# Patient Record
Sex: Female | Born: 2000 | Race: Asian | Hispanic: Yes | Marital: Single | Smoking: Never smoker
Health system: Southern US, Community
[De-identification: ages and names within clinical notes are randomized; demographics above are authoritative.]

---
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
## 2020-09-13 ENCOUNTER — Ambulatory Visit
Admission: EM | Admit: 2020-09-13 | Discharge: 2020-09-13 | Disposition: A | Discharge status: Home / Self Care | Payer: Medicaid Other | Attending: Emergency Medicine | Admitting: Emergency Medicine

## 2020-09-13 ENCOUNTER — Other Ambulatory Visit: Payer: Self-pay

## 2020-09-13 ENCOUNTER — Ambulatory Visit (INDEPENDENT_AMBULATORY_CARE_PROVIDER_SITE_OTHER): Payer: Medicaid Other

## 2020-09-13 DIAGNOSIS — S161XXA Strain of muscle, fascia and tendon at neck level, initial encounter: Secondary | ICD-10-CM

## 2020-09-13 DIAGNOSIS — M542 Cervicalgia: Secondary | ICD-10-CM | POA: Diagnosis not present

## 2020-09-13 DIAGNOSIS — S8001XA Contusion of right knee, initial encounter: Secondary | ICD-10-CM | POA: Diagnosis not present

## 2020-09-13 DIAGNOSIS — M25561 Pain in right knee: Secondary | ICD-10-CM | POA: Diagnosis not present

## 2020-09-13 MED ORDER — TIZANIDINE HCL 4 MG PO TABS: 4.0000 mg | ORAL_TABLET | Freq: Four times a day (QID) | ORAL | 0 refills | Status: DC | PRN

## 2020-09-13 MED ORDER — IBUPROFEN 600 MG PO TABS: 600.0000 mg | ORAL_TABLET | Freq: Four times a day (QID) | ORAL | 0 refills | Status: DC | PRN
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
## 2020-09-13 NOTE — Discharge Instructions (Addendum)
Take the ibuprofen every 6 hours as needed for pain and inflammation.  Take it with food.  Take the tizanidine every 6 hours as needed for spasm in your neck.  This will make you drowsy so you may want to save it for bedtime.  Follow the neck exercises given to you at discharge.  You may use ice or moist heat to help with the pain and inflammation of your knee and neck.  If your symptoms continue or worsen return or follow-up with your primary care provider.
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
## 2021-04-14 ENCOUNTER — Other Ambulatory Visit: Payer: Self-pay

## 2021-04-14 ENCOUNTER — Ambulatory Visit (LOCAL_COMMUNITY_HEALTH_CENTER): Payer: Medicaid Other

## 2021-04-14 DIAGNOSIS — Z5321 Procedure and treatment not carried out due to patient leaving prior to being seen by health care provider: Secondary | ICD-10-CM

## 2021-04-14 NOTE — Progress Notes (Signed)
Client and older lady knocked on door while RN on phone with Spanish speaking client and agency interpreter. Older lady stated immediately "we thought she needed a Hepatitis B." Client holding a [facility] record in her hand. Requested they have a seat in waiting room until phone call completed. When phone call completed with interpreter, Suzette Battiest came to clinic. RN unable client / lady with her. Client not in main waiting room, Nurse Clinic waiting room or first floor public bathroom. Clerk at information booth states did see 2 ladies very recently getting on elevator.Following review of immunization record, vaccines are up to date. Client could receive Covid booster and #2 Bexsero. Due to age of 22 years, would need to get Bexsero from another clinic ([facility] standing orders are to administer to ages 34 - 26 years). Jossie Ng, RN

## 2021-09-13 DIAGNOSIS — Z3493 Encounter for supervision of normal pregnancy, unspecified, third trimester: Secondary | ICD-10-CM | POA: Insufficient documentation

## 2021-09-14 LAB — OB RESULTS CONSOLE HEPATITIS B SURFACE ANTIGEN: Hepatitis B Surface Ag: NEGATIVE

## 2021-09-14 LAB — OB RESULTS CONSOLE HIV ANTIBODY (ROUTINE TESTING): HIV: NONREACTIVE

## 2021-09-14 LAB — OB RESULTS CONSOLE RPR: RPR: NONREACTIVE

## 2021-09-14 LAB — OB RESULTS CONSOLE VARICELLA ZOSTER ANTIBODY, IGG: Varicella: NON-IMMUNE/NOT IMMUNE

## 2021-09-14 LAB — OB RESULTS CONSOLE RUBELLA ANTIBODY, IGM: Rubella: IMMUNE

## 2021-11-13 NOTE — L&D Delivery Note (Signed)
Delivery Note  First Stage: Labor onset: 6/13 at 1900 Augmentation: AROM, Pitocin Analgesia /Anesthesia intrapartum: IVPM x 2, epidural AROM at 1426 clear  Second Stage: Complete dilation at 1704 Onset of pushing at 1710 FHR second stage Cat II variables  Delivery of a viable female at 04/26/22 at 1734 by CNM delivery of fetal head in LOA position with restitution to LOT. Thick meconium fluid noted after delivery of head, no nuchal cord;  Anterior then posterior shoulders delivered easily with gentle downward traction. Baby placed on mom's chest, and attended to by peds.  Cord double clamped after cessation of pulsation, cut by FOB Cord blood sample collected    Third Stage: Placenta delivered spontaneously intact with 3VC @ 1740 Placenta disposition: routine disposal Uterine tone firm / bleeding scant  Left labial laceration identified, hemostatic Anesthesia for repair: n/a Est. Blood Loss (mL): 100  Complications: none  Mom to postpartum.  Baby to Couplet care / Skin to Skin.  Newborn: Birth Weight: pending  Apgar Scores: 8/9 Feeding planned: breast and formula

## 2022-02-15 ENCOUNTER — Other Ambulatory Visit: Payer: Self-pay | Admitting: Obstetrics

## 2022-02-15 DIAGNOSIS — D509 Iron deficiency anemia, unspecified: Secondary | ICD-10-CM

## 2022-02-20 ENCOUNTER — Ambulatory Visit
Admission: RE | Admit: 2022-02-20 | Discharge: 2022-02-20 | Disposition: A | Discharge status: Home / Self Care | Payer: Medicaid Other | Source: Ambulatory Visit | Attending: Obstetrics | Admitting: Obstetrics

## 2022-02-20 DIAGNOSIS — D509 Iron deficiency anemia, unspecified: Secondary | ICD-10-CM | POA: Insufficient documentation

## 2022-02-20 MED ORDER — SODIUM CHLORIDE 0.9 % IV SOLN
300.0000 mg | INTRAVENOUS | Status: DC
  Administered 2022-02-20: 300 mg via INTRAVENOUS
  Filled 2022-02-20: qty 300

## 2022-02-27 ENCOUNTER — Ambulatory Visit
Admission: RE | Admit: 2022-02-27 | Discharge: 2022-02-27 | Disposition: A | Discharge status: Home / Self Care | Payer: Medicaid Other | Source: Ambulatory Visit | Attending: Obstetrics | Admitting: Obstetrics

## 2022-02-27 DIAGNOSIS — D509 Iron deficiency anemia, unspecified: Secondary | ICD-10-CM | POA: Insufficient documentation

## 2022-02-27 MED ORDER — SODIUM CHLORIDE FLUSH 0.9 % IV SOLN
INTRAVENOUS | Status: AC
  Filled 2022-02-27: qty 10

## 2022-02-27 MED ORDER — IRON SUCROSE 20 MG/ML IV SOLN
300.0000 mg | Freq: Once | INTRAVENOUS | Status: AC
  Administered 2022-02-27: 300 mg via INTRAVENOUS
  Filled 2022-02-27: qty 300

## 2022-03-06 ENCOUNTER — Ambulatory Visit
Admission: RE | Admit: 2022-03-06 | Discharge: 2022-03-06 | Disposition: A | Discharge status: Home / Self Care | Payer: Medicaid Other | Source: Ambulatory Visit | Attending: Obstetrics | Admitting: Obstetrics

## 2022-03-06 DIAGNOSIS — O99013 Anemia complicating pregnancy, third trimester: Secondary | ICD-10-CM | POA: Insufficient documentation

## 2022-03-06 DIAGNOSIS — D509 Iron deficiency anemia, unspecified: Secondary | ICD-10-CM | POA: Diagnosis not present

## 2022-03-06 MED ORDER — SODIUM CHLORIDE 0.9 % IV SOLN
300.0000 mg | Freq: Once | INTRAVENOUS | Status: AC
  Administered 2022-03-06: 300 mg via INTRAVENOUS
  Filled 2022-03-06: qty 300

## 2022-03-13 ENCOUNTER — Ambulatory Visit
Admission: RE | Admit: 2022-03-13 | Discharge: 2022-03-13 | Disposition: A | Discharge status: Home / Self Care | Payer: Medicaid Other | Source: Ambulatory Visit | Attending: Obstetrics | Admitting: Obstetrics

## 2022-03-13 DIAGNOSIS — D509 Iron deficiency anemia, unspecified: Secondary | ICD-10-CM | POA: Diagnosis present

## 2022-03-13 MED ORDER — SODIUM CHLORIDE 0.9 % IV SOLN
300.0000 mg | Freq: Once | INTRAVENOUS | Status: AC
  Administered 2022-03-13: 300 mg via INTRAVENOUS
  Filled 2022-03-13: qty 300

## 2022-04-04 LAB — OB RESULTS CONSOLE GC/CHLAMYDIA
Chlamydia: NEGATIVE
Neisseria Gonorrhea: NEGATIVE

## 2022-04-04 LAB — OB RESULTS CONSOLE GBS: GBS: NEGATIVE

## 2022-04-25 ENCOUNTER — Encounter: Payer: Self-pay | Admitting: Obstetrics and Gynecology

## 2022-04-25 ENCOUNTER — Observation Stay
Admission: EM | Admit: 2022-04-25 | Discharge: 2022-04-26 | Disposition: A | Discharge status: Home / Self Care | Payer: Medicaid Other | Source: Home / Self Care | Admitting: Obstetrics and Gynecology

## 2022-04-25 DIAGNOSIS — N858 Other specified noninflammatory disorders of uterus: Secondary | ICD-10-CM | POA: Diagnosis present

## 2022-04-25 DIAGNOSIS — O471 False labor at or after 37 completed weeks of gestation: Secondary | ICD-10-CM | POA: Insufficient documentation

## 2022-04-25 DIAGNOSIS — Z3A39 39 weeks gestation of pregnancy: Secondary | ICD-10-CM | POA: Insufficient documentation

## 2022-04-25 NOTE — OB Triage Note (Signed)
Notified F. Bobette Mo, CNM of patient's arrival. SBAR given. Telephone orders given to recheck patient in 2 hours and rule out for labor. Will notify patient on plan of care.

## 2022-04-26 ENCOUNTER — Inpatient Hospital Stay
Admission: EM | Admit: 2022-04-26 | Discharge: 2022-04-28 | DRG: 806 | Disposition: A | Discharge status: Home / Self Care | Payer: Medicaid Other | Attending: Obstetrics and Gynecology | Admitting: Obstetrics and Gynecology

## 2022-04-26 ENCOUNTER — Inpatient Hospital Stay: Payer: Medicaid Other | Admitting: Anesthesiology

## 2022-04-26 ENCOUNTER — Other Ambulatory Visit: Payer: Self-pay

## 2022-04-26 ENCOUNTER — Encounter: Payer: Self-pay | Admitting: Obstetrics and Gynecology

## 2022-04-26 DIAGNOSIS — Z3A39 39 weeks gestation of pregnancy: Secondary | ICD-10-CM | POA: Diagnosis not present

## 2022-04-26 DIAGNOSIS — D62 Acute posthemorrhagic anemia: Secondary | ICD-10-CM | POA: Diagnosis not present

## 2022-04-26 DIAGNOSIS — O26893 Other specified pregnancy related conditions, third trimester: Secondary | ICD-10-CM | POA: Diagnosis present

## 2022-04-26 DIAGNOSIS — O9081 Anemia of the puerperium: Secondary | ICD-10-CM | POA: Diagnosis not present

## 2022-04-26 DIAGNOSIS — N858 Other specified noninflammatory disorders of uterus: Secondary | ICD-10-CM | POA: Diagnosis present

## 2022-04-26 LAB — TYPE AND SCREEN
ABO/RH(D): O POS
Antibody Screen: NEGATIVE

## 2022-04-26 LAB — CBC
HCT: 37.1 % (ref 36.0–46.0)
Hemoglobin: 13 g/dL (ref 12.0–15.0)
MCH: 30.5 pg (ref 26.0–34.0)
MCHC: 35 g/dL (ref 30.0–36.0)
MCV: 87.1 fL (ref 80.0–100.0)
Platelets: 187 10*3/uL (ref 150–400)
RBC: 4.26 MIL/uL (ref 3.87–5.11)
WBC: 13.2 10*3/uL — ABNORMAL HIGH (ref 4.0–10.5)
nRBC: 0 % (ref 0.0–0.2)

## 2022-04-26 LAB — RPR: RPR Ser Ql: NONREACTIVE

## 2022-04-26 LAB — ABO/RH: ABO/RH(D): O POS

## 2022-04-26 MED ORDER — BUPIVACAINE HCL (PF) 0.25 % IJ SOLN
INTRAMUSCULAR | Status: DC | PRN
  Administered 2022-04-26: 3 mL via EPIDURAL
  Administered 2022-04-26: 2 mL via EPIDURAL

## 2022-04-26 MED ORDER — ZOLPIDEM TARTRATE 5 MG PO TABS: 5.0000 mg | ORAL_TABLET | Freq: Every evening | ORAL | Status: DC | PRN

## 2022-04-26 MED ORDER — LACTATED RINGERS IV SOLN: 500.0000 mL | INTRAVENOUS | Status: DC | PRN

## 2022-04-26 MED ORDER — OXYTOCIN-SODIUM CHLORIDE 30-0.9 UT/500ML-% IV SOLN
2.5000 [IU]/h | INTRAVENOUS | Status: DC
  Administered 2022-04-26: 2.5 [IU]/h via INTRAVENOUS

## 2022-04-26 MED ORDER — SOD CITRATE-CITRIC ACID 500-334 MG/5ML PO SOLN: 30.0000 mL | ORAL | Status: DC | PRN

## 2022-04-26 MED ORDER — DIPHENHYDRAMINE HCL 25 MG PO CAPS: 25.0000 mg | ORAL_CAPSULE | Freq: Four times a day (QID) | ORAL | Status: DC | PRN

## 2022-04-26 MED ORDER — ZOLPIDEM TARTRATE 5 MG PO TABS
ORAL_TABLET | ORAL | Status: DC
Start: 2022-04-26 — End: 2022-04-26
  Filled 2022-04-26: qty 1

## 2022-04-26 MED ORDER — FENTANYL-BUPIVACAINE-NACL 0.5-0.125-0.9 MG/250ML-% EP SOLN
12.0000 mL/h | EPIDURAL | Status: DC | PRN
  Administered 2022-04-26: 12 mL/h via EPIDURAL
  Filled 2022-04-26: qty 250

## 2022-04-26 MED ORDER — FENTANYL CITRATE (PF) 100 MCG/2ML IJ SOLN
50.0000 ug | INTRAMUSCULAR | Status: DC | PRN
  Administered 2022-04-26 (×2): 100 ug via INTRAVENOUS
  Filled 2022-04-26 (×2): qty 2

## 2022-04-26 MED ORDER — LIDOCAINE-EPINEPHRINE (PF) 1.5 %-1:200000 IJ SOLN
INTRAMUSCULAR | Status: DC | PRN
  Administered 2022-04-26: 3 mL via PERINEURAL

## 2022-04-26 MED ORDER — LACTATED RINGERS IV SOLN
500.0000 mL | Freq: Once | INTRAVENOUS | Status: AC
  Administered 2022-04-26: 500 mL via INTRAVENOUS

## 2022-04-26 MED ORDER — LACTATED RINGERS IV SOLN: INTRAVENOUS | Status: DC

## 2022-04-26 MED ORDER — SIMETHICONE 80 MG PO CHEW: 80.0000 mg | CHEWABLE_TABLET | ORAL | Status: DC | PRN

## 2022-04-26 MED ORDER — ACETAMINOPHEN 325 MG PO TABS: 650.0000 mg | ORAL_TABLET | ORAL | Status: DC | PRN

## 2022-04-26 MED ORDER — IBUPROFEN 600 MG PO TABS
600.0000 mg | ORAL_TABLET | Freq: Four times a day (QID) | ORAL | Status: DC
  Administered 2022-04-26 – 2022-04-28 (×7): 600 mg via ORAL
  Filled 2022-04-26 (×8): qty 1

## 2022-04-26 MED ORDER — OXYTOCIN 10 UNIT/ML IJ SOLN
INTRAMUSCULAR | Status: AC
  Filled 2022-04-26: qty 2

## 2022-04-26 MED ORDER — EPHEDRINE 5 MG/ML INJ: 10.0000 mg | INTRAVENOUS | Status: DC | PRN

## 2022-04-26 MED ORDER — TERBUTALINE SULFATE 1 MG/ML IJ SOLN: 0.2500 mg | Freq: Once | INTRAMUSCULAR | Status: DC | PRN

## 2022-04-26 MED ORDER — FERROUS SULFATE 325 (65 FE) MG PO TABS
325.0000 mg | ORAL_TABLET | Freq: Two times a day (BID) | ORAL | Status: DC
  Administered 2022-04-27 – 2022-04-28 (×3): 325 mg via ORAL
  Filled 2022-04-26 (×3): qty 1

## 2022-04-26 MED ORDER — WITCH HAZEL-GLYCERIN EX PADS: 1.0000 "application " | MEDICATED_PAD | CUTANEOUS | Status: DC | PRN

## 2022-04-26 MED ORDER — ONDANSETRON HCL 4 MG/2ML IJ SOLN
4.0000 mg | Freq: Four times a day (QID) | INTRAMUSCULAR | Status: DC | PRN
  Administered 2022-04-26: 4 mg via INTRAVENOUS
  Filled 2022-04-26: qty 2

## 2022-04-26 MED ORDER — CALCIUM CARBONATE ANTACID 500 MG PO CHEW: 2.0000 | CHEWABLE_TABLET | ORAL | Status: DC | PRN

## 2022-04-26 MED ORDER — PRENATAL MULTIVITAMIN CH
1.0000 | ORAL_TABLET | Freq: Every day | ORAL | Status: DC
  Administered 2022-04-27: 1 via ORAL
  Filled 2022-04-26: qty 1

## 2022-04-26 MED ORDER — AMMONIA AROMATIC IN INHA
RESPIRATORY_TRACT | Status: AC
  Filled 2022-04-26: qty 10

## 2022-04-26 MED ORDER — ZOLPIDEM TARTRATE 5 MG PO TABS
5.0000 mg | ORAL_TABLET | Freq: Once | ORAL | Status: AC
  Administered 2022-04-26: 5 mg via ORAL

## 2022-04-26 MED ORDER — PHENYLEPHRINE 80 MCG/ML (10ML) SYRINGE FOR IV PUSH (FOR BLOOD PRESSURE SUPPORT): 80.0000 ug | PREFILLED_SYRINGE | INTRAVENOUS | Status: DC | PRN

## 2022-04-26 MED ORDER — ONDANSETRON HCL 4 MG PO TABS: 4.0000 mg | ORAL_TABLET | ORAL | Status: DC | PRN

## 2022-04-26 MED ORDER — PRENATAL MULTIVITAMIN CH: 1.0000 | ORAL_TABLET | Freq: Every day | ORAL | Status: DC

## 2022-04-26 MED ORDER — ACETAMINOPHEN 325 MG PO TABS
650.0000 mg | ORAL_TABLET | ORAL | Status: DC | PRN
  Administered 2022-04-26 – 2022-04-27 (×2): 650 mg via ORAL
  Filled 2022-04-26 (×2): qty 2

## 2022-04-26 MED ORDER — DIPHENHYDRAMINE HCL 50 MG/ML IJ SOLN: 12.5000 mg | INTRAMUSCULAR | Status: DC | PRN

## 2022-04-26 MED ORDER — COCONUT OIL OIL: 1.0000 "application " | TOPICAL_OIL | Status: DC | PRN

## 2022-04-26 MED ORDER — DIBUCAINE (PERIANAL) 1 % EX OINT: 1.0000 "application " | TOPICAL_OINTMENT | CUTANEOUS | Status: DC | PRN

## 2022-04-26 MED ORDER — ONDANSETRON HCL 4 MG/2ML IJ SOLN: 4.0000 mg | INTRAMUSCULAR | Status: DC | PRN

## 2022-04-26 MED ORDER — LIDOCAINE HCL (PF) 1 % IJ SOLN: 30.0000 mL | INTRAMUSCULAR | Status: DC | PRN

## 2022-04-26 MED ORDER — LIDOCAINE HCL (PF) 1 % IJ SOLN
INTRAMUSCULAR | Status: AC
  Filled 2022-04-26: qty 30

## 2022-04-26 MED ORDER — BENZOCAINE-MENTHOL 20-0.5 % EX AERO
1.0000 "application " | INHALATION_SPRAY | CUTANEOUS | Status: DC | PRN
  Filled 2022-04-26: qty 56

## 2022-04-26 MED ORDER — OXYTOCIN BOLUS FROM INFUSION
333.0000 mL | Freq: Once | INTRAVENOUS | Status: AC
  Administered 2022-04-26: 333 mL via INTRAVENOUS

## 2022-04-26 MED ORDER — SENNOSIDES-DOCUSATE SODIUM 8.6-50 MG PO TABS
2.0000 | ORAL_TABLET | Freq: Every day | ORAL | Status: DC
  Administered 2022-04-27: 2 via ORAL
  Filled 2022-04-26: qty 2

## 2022-04-26 MED ORDER — OXYTOCIN-SODIUM CHLORIDE 30-0.9 UT/500ML-% IV SOLN
1.0000 m[IU]/min | INTRAVENOUS | Status: DC
  Administered 2022-04-26: 2 m[IU]/min via INTRAVENOUS

## 2022-04-26 MED ORDER — OXYTOCIN-SODIUM CHLORIDE 30-0.9 UT/500ML-% IV SOLN
INTRAVENOUS | Status: AC
  Filled 2022-04-26: qty 500

## 2022-04-26 MED ORDER — DOCUSATE SODIUM 100 MG PO CAPS: 100.0000 mg | ORAL_CAPSULE | Freq: Every day | ORAL | Status: DC

## 2022-04-26 MED ORDER — LIDOCAINE HCL (PF) 1 % IJ SOLN
INTRAMUSCULAR | Status: DC | PRN
  Administered 2022-04-26: 2 mL

## 2022-04-26 NOTE — OB Triage Note (Signed)
Aimee Sandoval is a 21 y.o. female. She is at [redacted]w[redacted]d gestation. No LMP recorded. Patient is pregnant. Estimated Date of Delivery: 04/29/22  Prenatal care site: [facility]  Chief complaint: painful uterine contractions  HPI: Aimee Sandoval presents to L&D with complaints of uterine contractions with some N/V  Factors complicating pregnancy: Anemia Uterine size discrepancy 3rd trimester EFW 5.9 lbs 03/28/22 Varicella non immune  S: Resting comfortably. no CTX, no VB.no LOF,  Active fetal movement.   Maternal Medical History:  Past Medical Hx:  has no past medical history on file.    Past Surgical Hx:  has no past surgical history on file.   No Known Allergies   Prior to Admission medications   Medication Sig Start Date End Date Taking? Authorizing Provider  ibuprofen (ADVIL) 600 MG tablet Take 1 tablet (600 mg total) by mouth every 6 (six) hours as needed. 09/13/20   Becky Augusta, NP  tiZANidine (ZANAFLEX) 4 MG tablet Take 1 tablet (4 mg total) by mouth every 6 (six) hours as needed for muscle spasms. 09/13/20   Becky Augusta, NP    Social History: She  reports that she has never smoked. She has never used smokeless tobacco. She reports that she does not drink alcohol and does not use drugs.  Family History: family history includes Healthy in her father and mother. ,no history of gyn cancers  Review of Systems: A full review of systems was performed and negative except as noted in the HPI.    O:  There were no vitals taken for this visit. No results found for this or any previous visit (from the past 48 hour(s)).   Constitutional: NAD, AAOx3  HE/ENT: extraocular movements grossly intact, moist mucous membranes CV: RRR PULM: nl respiratory effort Abd: gravid, non-tender, non-distended, soft  Ext: Non-tender, Nonedmeatous Psych: mood appropriate, speech normal Pelvic : deferred SVE: Dilation: 3 Effacement (%): 80 Station: -2 Presentation: Vertex Exam by:: Chari Manning CNM   Fetal Monitor: Baseline: 130 bpm Variability: moderate Accels: Present Decels: none Toco: regular, every 1.5- 3 minutes  Category: I NST: reactive   Assessment: 21 y.o. [redacted]w[redacted]d here for antenatal surveillance during pregnancy.  Principle diagnosis: [redacted] Wks Pregnant/Contractions   Plan: Labor: not present.  NST reviewed  Fetal Wellbeing: Reassuring Cat 1 tracing. Reactive NST  D/c home stable, precautions reviewed, follow-up as scheduled.   ----- Chari Manning, CNM Certified Nurse Midwife [facility]

## 2022-04-26 NOTE — Discharge Summary (Signed)
Aimee Sandoval is a 21 y.o. female. She is at [redacted]w[redacted]d gestation. No LMP recorded. Patient is pregnant. Estimated Date of Delivery: 04/29/22  Prenatal care site: [facility]  Chief complaint: painful uterine contractions  HPI: Truly presents to L&D with complaints of uterine contractions since 1900  Factors complicating pregnancy: Anemia Varicella non immune Uterine size discrepancy in 3rd trimester  S: Resting comfortably. no VB.no LOF,  Active fetal movement.   Maternal Medical History:  Past Medical Hx:  has no past medical history on file.    Past Surgical Hx:  has no past surgical history on file.   No Known Allergies   Prior to Admission medications   Medication Sig Start Date End Date Taking? Authorizing Provider  ibuprofen (ADVIL) 600 MG tablet Take 1 tablet (600 mg total) by mouth every 6 (six) hours as needed. 09/13/20   Becky Augusta, NP  tiZANidine (ZANAFLEX) 4 MG tablet Take 1 tablet (4 mg total) by mouth every 6 (six) hours as needed for muscle spasms. 09/13/20   Becky Augusta, NP    Social History: She  reports that she has never smoked. She has never used smokeless tobacco. She reports that she does not drink alcohol and does not use drugs.  Family History: family history includes Healthy in her father and mother. ,no history of gyn cancers  Review of Systems: A full review of systems was performed and negative except as noted in the HPI.    O:  BP 116/77   Pulse (!) 116   Temp 98.2 F (36.8 C) (Oral)   Resp 18  No results found for this or any previous visit (from the past 48 hour(s)).   Constitutional: NAD, AAOx3  HE/ENT: extraocular movements grossly intact, moist mucous membranes CV: RRR PULM: nl respiratory effort Abd: gravid, non-tender, non-distended, soft  Ext: Non-tender, Nonedmeatous Psych: mood appropriate, speech normal Pelvic : deferred SVE: Dilation: 1.5 Effacement (%): 50 Cervical Position: Posterior Station:  Ballotable Presentation: Undeterminable Exam by:: D. Means, RN   Fetal Monitor: Baseline: 130 bpm Variability: moderate Accels: Present Decels: none Toco: irregular, every 4-8 minutes  Category: I NST: reactive   Assessment: 21 y.o. [redacted]w[redacted]d here for antenatal surveillance during pregnancy.  Principle diagnosis: Alteration in comfort associated with uterine contractions [N85.8]   Plan: Labor: prodromal NST reviewed  Fetal Wellbeing: Reassuring Cat 1 tracing. Reactive NST  Cervix unchanged after 2 hours Ambien given for sleep aid D/c home stable, precautions reviewed,patient to return to L& D when contractions are consistently 5 minutes apart for 1 hour. ----- Chari Manning, CNM Certified Nurse Midwife [place]  [facility]

## 2022-04-26 NOTE — OB Triage Note (Signed)
Aimee Sandoval 20 y.o. presents to Labor & Delivery triage via wheelchair steered by ED staff reporting Contractions q 5-6 minutes. She is a G1P0 at [redacted]w[redacted]d . She denies signs and symptoms consistent with rupture of membranes or active vaginal bleeding. She  states she feels positive fetal movement. External FM and TOCO applied to non-tender abdomen. Initial FHR 130. Vital signs obtained and within normal limits. We discussed her labor plan of "no epidural unless the pain gets much worse than this". Patient oriented to care environment including call bell and bed control use. Chari Manning, CNM notified of patient's arrival and assessed at bedside. Plan for RN to reassess cervix in 1 hour.

## 2022-04-26 NOTE — Discharge Summary (Signed)
Obstetrical Discharge Summary  Patient Name: Aimee Sandoval DOB: Dec 16, 2000 MRN: [id number]  Date of Admission: 04/26/2022 Date of Delivery: 04/26/22 Delivered by: Aimee Sandoval CNM Date of Discharge: 04/27/2022  [facility]  LMP:No LMP recorded. EDC Estimated Date of Delivery: 04/29/22 Gestational Age at Delivery: [redacted]w[redacted]d   Antepartum complications:  1. Iron deficiency anemia, s/p oral supplement and iron infusions 2. Uterine size/date discrepancy 3. UTI in early pregnancy, TOC neg.  4. Varicella NON-immune  Admitting Diagnosis: Labor, 39wks Secondary Diagnosis: SVD, Labial  Patient Active Problem List   Diagnosis Date Noted   Alteration in comfort associated with uterine contractions 04/26/2022   Labor and delivery, indication for care 04/26/2022   Encounter for supervision of normal pregnancy in third trimester 09/13/2021    Augmentation: AROM and Pitocin Complications: None Intrapartum complications/course: see delivery note Date of Delivery: 04/26/22 Delivered By: Aimee Sandoval CNM Delivery Type: spontaneous vaginal delivery Anesthesia: epidural Placenta: spontaneous Laceration: left labial, hemostatic Episiotomy: none Newborn Data: Live born female  Birth Weight:  7lb 0.5oz APGAR: 8, 9  Newborn Delivery   Birth date/time: 04/26/2022 17:34:00 Delivery type: Vaginal, Spontaneous      Postpartum Procedures: none  Edinburgh:      No data to display           Post partum course:   Patient had an uncomplicated postpartum course.  By time of discharge on PPD#1, her pain was controlled on oral pain medications; she had appropriate lochia and was ambulating, voiding without difficulty and tolerating regular diet.  She was deemed stable for discharge to home.    Discharge Physical Exam:   BP 104/74 (BP Location: Right Arm)   Pulse 91   Temp 97.7 F (36.5 C)   Resp 18   Ht 5\' 1"  (1.549 m)   Wt 62.1 kg   SpO2 100%   Breastfeeding Unknown   BMI  25.89 kg/m   General: NAD CV: RRR Pulm: CTABL, nl effort ABD: s/nd/nt, fundus firm and below the umbilicus Lochia: moderate Perineum: well approximated DVT Evaluation: LE non-ttp, no evidence of DVT on exam.  Hemoglobin  Date Value Ref Range Status  04/27/2022 12.4 12.0 - 15.0 g/dL Final   HCT  Date Value Ref Range Status  04/27/2022 36.2 36.0 - 46.0 % Final    Comment:    REPEATED TO VERIFY     Disposition: stable, discharge to home. Baby Feeding: breastmilk and formula Baby Disposition: home with mom  Rh Immune globulin given: n/a Rubella vaccine given: immune Varicella vaccine given: NON-immune, offered vaccination Tdap vaccine given in AP or PP setting: offered  Flu vaccine given in AP or PP setting: due in season  Contraception: declined  Prenatal Labs:  Blood type/Rh O Pos  Antibody screen neg  Rubella Immune  Varicella NON-Immune  RPR NR  HBsAg Neg  HIV NR  GC neg  Chlamydia neg  Genetic screening Negative MaterniT21, female   1 hour GTT  73  3 hour GTT  N/a  GBS  Neg     Plan:  Aimee Sandoval was discharged to home in good condition. Follow-up appointment with delivering provider in 6 weeks.  Discharge Medications: Allergies as of 04/27/2022   No Known Allergies      Medication List     STOP taking these medications    tiZANidine 4 MG tablet Commonly known as: Zanaflex       TAKE these medications    acetaminophen 325 MG tablet Commonly known as:  Tylenol Take 2 tablets (650 mg total) by mouth every 4 (four) hours as needed (for pain scale < 4).   benzocaine-Menthol 20-0.5 % Aero Commonly known as: DERMOPLAST Apply 1 Application topically as needed for irritation (perineal discomfort).   ferrous sulfate 325 (65 FE) MG tablet Take 1 tablet (325 mg total) by mouth 2 (two) times daily with a meal.   ibuprofen 600 MG tablet Commonly known as: ADVIL Take 1 tablet (600 mg total) by mouth every 6 (six) hours as needed.    prenatal multivitamin Tabs tablet Take 1 tablet by mouth daily at 12 noon.   witch hazel-glycerin pad Commonly known as: TUCKS Apply 1 Application topically as needed for hemorrhoids.         Follow-up Information     Aimee Sandoval, Aimee Sandoval, CNM Follow up in 6 week(s).   Specialty: Obstetrics and Gynecology Why: 6wk postpartum, declines contraception Contact information: [address] [place] [address] [phone]                 Signed:  Janyce Sandoval, CNM 04/27/2022  10:23 AM

## 2022-04-26 NOTE — H&P (Signed)
OB History & Physical   History of Present Illness:  Chief Complaint: painful UCs, worse since seen overnight in triage.   HPI:  Aimee Sandoval is a 21 y.o. G1P0 female at [redacted]w[redacted]d dated by LMP and c/w [place] at [redacted]w[redacted]d; EDD 04/29/22.  She presents to L&D for   Active FM, denies LOF or VB. Having painful UCs.     Pregnancy Issues: 1. Iron deficiency anemia, s/p oral supplement and iron infusions 2. Uterine size/date discrepancy 3. UTI in early pregnancy, TOC neg.  4. Varicella NON-immune   Maternal Medical History:  History reviewed. No pertinent past medical history.  History reviewed. No pertinent surgical history.  No Known Allergies  Prior to Admission medications   Medication Sig Start Date End Date Taking? Authorizing Provider  ibuprofen (ADVIL) 600 MG tablet Take 1 tablet (600 mg total) by mouth every 6 (six) hours as needed. 09/13/20   Margarette Canada, NP  tiZANidine (ZANAFLEX) 4 MG tablet Take 1 tablet (4 mg total) by mouth every 6 (six) hours as needed for muscle spasms. 09/13/20   Margarette Canada, NP     Prenatal care site: [facility] History: She  reports that she has never smoked. She has never used smokeless tobacco. She reports that she does not drink alcohol and does not use drugs.  Family History: family history includes Healthy in her father and mother.   Review of Systems: A full review of systems was performed and negative except as noted in the HPI.     Physical Exam:  Vital Signs: BP (!) 114/55 (BP Location: Left Arm)   Pulse (!) 131   Temp 98.2 F (36.8 C) (Oral)   Resp 20   Ht 5\' 1"  (1.549 m)   Wt 62.1 kg   BMI 25.89 kg/m   General: no acute distress.  HEENT: normocephalic, atraumatic Heart: regular rate & rhythm.  No murmurs/rubs/gallops Lungs: clear to auscultation bilaterally, normal respiratory effort Abdomen: soft, gravid, non-tender;  EFW: 7lbs Pelvic:   External: Normal external female genitalia  Cervix: Dilation: 3.5 /  Effacement (%): 80 / Station: -2    Extremities: non-tender, symmetric, no edema bilaterally.  DTRs: 2+  Neurologic: Alert & oriented x 3.    No results found for this or any previous visit (from the past 24 hour(s)).  Pertinent Results:  Prenatal Labs: Blood type/Rh O Pos  Antibody screen neg  Rubella Immune  Varicella NON-Immune  RPR NR  HBsAg Neg  HIV NR  GC neg  Chlamydia neg  Genetic screening Negative MaterniT21, female   1 hour GTT  73  3 hour GTT  N/a  GBS  Neg   FHT:  130bpm, mod variability, + accels, no decels TOCO: q2-59min SVE:  Dilation: 3.5 / Effacement (%): 80 / Station: -2    Cephalic by leopolds/SVE  No results found.  Assessment:  Aimee Sandoval is a 21 y.o. G1P0 female at [redacted]w[redacted]d with labor.   Plan:  1. Admit to Labor & Delivery; consents reviewed and obtained - notified MD of admission  2. Fetal Well being  - Fetal Tracing: Cat I - Group B Streptococcus ppx indicated: Neg - Presentation: cephalic confirmed by exam   3. Routine OB: - Prenatal labs reviewed, as above - Rh O Pos - CBC, T&S, RPR on admit - Clear fluids, IVF  4. Monitoring of Labor -  Contractions: external toco in place -  Pelvis adequate for TOL -  Plan for induction  with AROM/Pitocin if slowed labor.  -  Plan for continuous fetal monitoring  -  Maternal pain control as desired - Anticipate vaginal delivery  5. Post Partum Planning: - Infant feeding: breast/bottle - Contraception: TBD - needs Tdap  Francetta Found, CNM 04/26/22 8:03 AM

## 2022-04-26 NOTE — Anesthesia Procedure Notes (Signed)
Epidural Patient location during procedure: OB Start time: 04/26/2022 12:25 PM End time: 04/26/2022 12:44 PM  Staffing Anesthesiologist: Reed Breech, MD Resident/CRNA: Ginger Carne, CRNA Performed: resident/CRNA   Preanesthetic Checklist Completed: patient identified, IV checked, site marked, risks and benefits discussed, surgical consent, monitors and equipment checked, pre-op evaluation and timeout performed  Epidural Patient position: sitting Prep: ChloraPrep Patient monitoring: heart rate, continuous pulse ox and blood pressure Approach: midline Location: L3-L4 Injection technique: LOR saline  Needle:  Needle type: Tuohy  Needle gauge: 17 G Needle length: 9 cm and 9 Needle insertion depth: 6.5 cm Catheter type: closed end flexible Catheter size: 19 Gauge Catheter at skin depth: 11 cm Test dose: negative and 1.5% lidocaine with Epi 1:200 K  Assessment Sensory level: T10 Events: blood not aspirated, injection not painful, no injection resistance, no paresthesia and negative IV test  Additional Notes 1 attempt Pt. Evaluated and documentation done after procedure finished. Patient identified. Risks/Benefits/Options discussed with patient including but not limited to bleeding, infection, nerve damage, paralysis, failed block, incomplete pain control, headache, blood pressure changes, nausea, vomiting, reactions to medication both or allergic, itching and postpartum back pain. Confirmed with bedside nurse the patient's most recent platelet count. Confirmed with patient that they are not currently taking any anticoagulation, have any bleeding history or any family history of bleeding disorders. Patient expressed understanding and wished to proceed. All questions were answered. Sterile technique was used throughout the entire procedure. Please see nursing notes for vital signs. Test dose was given through epidural catheter and negative prior to continuing to dose epidural  or start infusion. Warning signs of high block given to the patient including shortness of breath, tingling/numbness in hands, complete motor block, or any concerning symptoms with instructions to call for help. Patient was given instructions on fall risk and not to get out of bed. All questions and concerns addressed with instructions to call with any issues or inadequate analgesia.    Patient tolerated the insertion well without immediate complications.Reason for block:procedure for pain

## 2022-04-26 NOTE — OB Triage Note (Signed)
Pt arrived to unit with complaints of contractions. Patient is 105w3d G1P0. Pt placed on EFM monitor and Toco to non tender area of abdomen. Pt medical history reviewed. Consents for treatment signed. Patient denies LOF and vaginal bleeding. Pt reports contractions since 1900 tonight. Provider at nurses station and aware of patient's arrival. Will report chief complaint.

## 2022-04-26 NOTE — Anesthesia Preprocedure Evaluation (Signed)
Anesthesia Evaluation  Patient identified by MRN, date of birth, ID band Patient awake    Reviewed: Allergy & Precautions, H&P , NPO status , Patient's Chart, lab work & pertinent test results  History of Anesthesia Complications Negative for: history of anesthetic complications  Airway Mallampati: II  TM Distance: <3 FB Neck ROM: full  Mouth opening: Limited Mouth Opening  Dental no notable dental hx. (+) Teeth Intact   Pulmonary neg pulmonary ROS,    Pulmonary exam normal        Cardiovascular Exercise Tolerance: Good negative cardio ROS Normal cardiovascular exam     Neuro/Psych negative neurological ROS  negative psych ROS   GI/Hepatic negative GI ROS, Neg liver ROS,   Endo/Other  negative endocrine ROS  Renal/GU negative Renal ROS  negative genitourinary   Musculoskeletal   Abdominal   Peds  Hematology negative hematology ROS (+)   Anesthesia Other Findings   Reproductive/Obstetrics (+) Pregnancy                             Anesthesia Physical Anesthesia Plan  ASA: 2  Anesthesia Plan: Epidural   Post-op Pain Management:    Induction:   PONV Risk Score and Plan:   Airway Management Planned:   Additional Equipment:   Intra-op Plan:   Post-operative Plan:   Informed Consent: I have reviewed the patients History and Physical, chart, labs and discussed the procedure including the risks, benefits and alternatives for the proposed anesthesia with the patient or authorized representative who has indicated his/her understanding and acceptance.     Dental Advisory Given  Plan Discussed with: Anesthesiologist  Anesthesia Plan Comments:         Anesthesia Quick Evaluation

## 2022-04-27 LAB — CBC
HCT: 36.2 % (ref 36.0–46.0)
Hemoglobin: 12.4 g/dL (ref 12.0–15.0)
MCH: 30.4 pg (ref 26.0–34.0)
MCHC: 34.3 g/dL (ref 30.0–36.0)
MCV: 88.7 fL (ref 80.0–100.0)
Platelets: 201 10*3/uL (ref 150–400)
RBC: 4.08 MIL/uL (ref 3.87–5.11)
WBC: 11.9 10*3/uL — ABNORMAL HIGH (ref 4.0–10.5)
nRBC: 0 % (ref 0.0–0.2)

## 2022-04-27 MED ORDER — ACETAMINOPHEN 325 MG PO TABS: 650.0000 mg | ORAL_TABLET | ORAL | Status: AC | PRN

## 2022-04-27 MED ORDER — PRENATAL MULTIVITAMIN CH: 1.0000 | ORAL_TABLET | Freq: Every day | ORAL | Status: AC

## 2022-04-27 MED ORDER — WITCH HAZEL-GLYCERIN EX PADS
1.0000 | MEDICATED_PAD | CUTANEOUS | 12 refills | Status: DC | PRN
Start: 2022-04-27 — End: 2023-11-17

## 2022-04-27 MED ORDER — BENZOCAINE-MENTHOL 20-0.5 % EX AERO: 1.0000 | INHALATION_SPRAY | CUTANEOUS | Status: DC | PRN

## 2022-04-27 MED ORDER — FERROUS SULFATE 325 (65 FE) MG PO TABS: 325.0000 mg | ORAL_TABLET | Freq: Two times a day (BID) | ORAL | 3 refills | Status: DC

## 2022-04-27 NOTE — Lactation Note (Signed)
This note was copied from a baby's chart. Lactation Consultation Note  Patient Name: Aimee Sandoval EGBTD'V Date: 04/27/2022 Reason for consult: Initial assessment;Primapara;Term Age:21 hours  Maternal Data Has patient been taught Hand Expression?: No Does the patient have breastfeeding experience prior to this delivery?: No  Mom initially considered breastfeeding but at this time declines to breastfeed- she feels comfortable with formula/bottle feeding.  Feeding Mother's Current Feeding Choice: Formula Nipple Type: Slow - flow  Baby has been tolerating formula well, slight spit-up with last 2 feedings; Reviewed appropriate feeding volumes and frequency, and feeding during first 24 hours.  LATCH Score   Mom declines to breastfeed.  Lactation Tools Discussed/Used Tools: Bottle  Interventions Interventions: Education;Pace feeding  Discharge Discharge Education: Warning signs for feeding baby;Other (comment) (formula prep)  Consult Status Consult Status: Complete    Danford Bad 04/27/2022, 10:05 AM

## 2022-04-27 NOTE — Anesthesia Postprocedure Evaluation (Signed)
Anesthesia Post Note  Patient: Aimee Sandoval  Procedure(s) Performed: AN AD HOC LABOR EPIDURAL  Patient location during evaluation: Mother Baby Anesthesia Type: Epidural Level of consciousness: awake and alert Pain management: pain level controlled Vital Signs Assessment: post-procedure vital signs reviewed and stable Respiratory status: spontaneous breathing, nonlabored ventilation and respiratory function stable Cardiovascular status: stable Postop Assessment: no headache, no backache and epidural receding Anesthetic complications: no   No notable events documented.   Last Vitals:  Vitals:   04/27/22 0311 04/27/22 0757  BP: 103/60 104/74  Pulse: 95 91  Resp: 18 18  Temp: 36.5 C 36.5 C  SpO2: 100% 100%    Last Pain:  Vitals:   04/27/22 0752  TempSrc:   PainSc: 2                  Loni Delbridge B Alonza Smoker

## 2022-04-27 NOTE — Discharge Summary (Cosign Needed)
Obstetrical Discharge Summary  Patient Name: Aimee Sandoval DOB: [birth year] MRN: [id number]  Date of Admission: 04/26/2022 Date of Delivery: 04/26/2022 Delivered by: Bonnell Public, CNM Date of Discharge: 04/28/2022  Primary OB: [facility]  LMP:No LMP recorded. EDC Estimated Date of Delivery: 04/29/22 Gestational Age at Delivery: [redacted]w[redacted]d   Antepartum complications:  1. Iron deficiency anemia, s/p oral supplement and iron infusions 2. Uterine size/date discrepancy 3. UTI in early pregnancy, TOC neg.  4. Varicella NON-immune  Admitting Diagnosis: labor, 39wks Secondary Diagnosis: SVD, labial laceration Patient Active Problem List   Diagnosis Date Noted   NSVD (normal spontaneous vaginal delivery) 04/28/2022   Encounter for supervision of normal pregnancy in third trimester 09/13/2021    Augmentation: AROM and Pitocin Complications: None Intrapartum complications/course: see delivery note Date of Delivery: 04/26/2022 Delivered By: Bonnell Public, CNM Delivery Type: spontaneous vaginal delivery Anesthesia: epidural Placenta: spontaneous Laceration: left labial, hemostatic Episiotomy: none Newborn Data: Live born female  Birth Weight: 7 lb 0.5 oz (3190 g) APGAR: 8, 9  Newborn Delivery   Birth date/time: 04/26/2022 17:34:00 Delivery type: Vaginal, Spontaneous      Postpartum Procedures: none Edinburgh:     04/27/2022   11:45 AM  Inocente Salles Postnatal Depression Scale Screening Tool  I have been able to laugh and see the funny side of things. 0  I have looked forward with enjoyment to things. 0  I have blamed myself unnecessarily when things went wrong. 0  I have been anxious or worried for no good reason. 0  I have felt scared or panicky for no good reason. 0  Things have been getting on top of me. 0  I have been so unhappy that I have had difficulty sleeping. 0  I have felt sad or miserable. 0  I have been so unhappy that I have been crying. 0  The thought of  harming myself has occurred to me. 0  Edinburgh Postnatal Depression Scale Total 0      Post partum course:  Patient had an uncomplicated postpartum course.  By time of discharge on PPD#2, her pain was controlled on oral pain medications; she had appropriate lochia and was ambulating, voiding without difficulty and tolerating regular diet.  She was deemed stable for discharge to home.    Discharge Physical Exam:  BP 100/63 (BP Location: Right Arm)   Pulse 79   Temp 98.2 F (36.8 C)   Resp 18   Ht 5\' 1"  (1.549 m)   Wt 62.1 kg   SpO2 100%   Breastfeeding Unknown   BMI 25.89 kg/m   General: NAD CV: RRR Pulm: CTABL, nl effort ABD: s/nd/nt, fundus firm and below the umbilicus Lochia: moderate Perineum: well approximated/intact DVT Evaluation: LE non-ttp, no evidence of DVT on exam.  Hemoglobin  Date Value Ref Range Status  04/27/2022 12.4 12.0 - 15.0 g/dL Final   HCT  Date Value Ref Range Status  04/27/2022 36.2 36.0 - 46.0 % Final    Comment:    REPEATED TO VERIFY     Disposition: stable, discharge to home. Baby Feeding: breastmilk and formula Baby Disposition: home with mom  Rh Immune globulin given: n/a Rubella vaccine given: immune Varicella vaccine given: Declined  Tdap vaccine given in AP or PP setting: offered Flu vaccine given in AP or PP setting: due in season  Contraception: declined  Prenatal Labs:  Blood type/Rh O Pos  Antibody screen neg  Rubella Immune  Varicella NON-Immune  RPR NR  HBsAg Neg  HIV NR  GC neg  Chlamydia neg  Genetic screening Negative MaterniT21, female   1 hour GTT  73  3 hour GTT  N/a  GBS  Neg     Plan:  Aimee Sandoval was discharged to home in good condition. Follow-up appointment with delivering provider in 6 weeks.  Discharge Medications: Allergies as of 04/28/2022   No Known Allergies      Medication List     STOP taking these medications    tiZANidine 4 MG tablet Commonly known as: Zanaflex        TAKE these medications    acetaminophen 325 MG tablet Commonly known as: Tylenol Take 2 tablets (650 mg total) by mouth every 4 (four) hours as needed (for pain scale < 4).   benzocaine-Menthol 20-0.5 % Aero Commonly known as: DERMOPLAST Apply 1 Application topically as needed for irritation (perineal discomfort).   ferrous sulfate 325 (65 FE) MG tablet Take 1 tablet (325 mg total) by mouth 2 (two) times daily with a meal.   ibuprofen 600 MG tablet Commonly known as: ADVIL Take 1 tablet (600 mg total) by mouth every 6 (six) hours as needed.   prenatal multivitamin Tabs tablet Take 1 tablet by mouth daily at 12 noon.   witch hazel-glycerin pad Commonly known as: TUCKS Apply 1 Application topically as needed for hemorrhoids.         Follow-up Information     McVey, Prudencio Pair, CNM Follow up in 6 week(s).   Specialty: Obstetrics and Gynecology Why: Please call to schedule 6wk postpartum visit Contact information: [address] [place] [address] [phone]                 Signed:   Cyril Mourning  04/28/2022 9:38 AM

## 2022-04-27 NOTE — Progress Notes (Signed)
Post Partum Day 1 Subjective:  Aimee Sandoval desired to be discharged today. We discussed she may be able to be discharged after 24hrs postpartum if the baby is discharged by peds. Baby was not discharged, so [name] would like to stay one more day.  Doing well, no complaints.  Tolerating regular diet, pain with PO meds, voiding and ambulating without difficulty.  No CP SOB Fever,Chills, N/V or leg pain; denies nipple or breast pain, no HA change of vision, RUQ/epigastric pain  Objective: BP 106/68 (BP Location: Right Arm)   Pulse 83   Temp 98.2 F (36.8 C) (Oral)   Resp 18   Ht 5\' 1"  (1.549 m)   Wt 62.1 kg   SpO2 100%   Breastfeeding Unknown   BMI 25.89 kg/m    Physical Exam:  General: NAD Breasts: soft/nontender CV: RRR Pulm: nl effort, CTABL Abdomen: soft, NT, BS x 4 Perineum: lacerations hemostatic Lochia: moderate Uterine Fundus: fundus firm and 1 fb below umbilicus DVT Evaluation: no cords, ttp LEs   Recent Labs    04/26/22 0853 04/27/22 0515  HGB 13.0 12.4  HCT 37.1 36.2  WBC 13.2* 11.9*  PLT 187 201    Assessment/Plan: 21 y.o. G1P1001 postpartum day # 1  - Continue routine PP care - Lactation consult - Discussed contraceptive options including implant, IUDs hormonal and non-hormonal, injection, pills/ring/patch, condoms, and NFP.  - Acute blood loss anemia - hemodynamically stable and asymptomatic; start po ferrous sulfate BID with stool softeners   Disposition: Does not desire Dc home today.   04/29/22, CNM 04/27/2022 10:49 PM

## 2022-04-28 NOTE — Progress Notes (Signed)
Pt discharged with infant. Discharge instructions, prescriptions, and follow up appointments given to and reviewed with patient. Pt verbalized understanding. To be escorted out by auxillary.  °

## 2023-04-18 DIAGNOSIS — Z3493 Encounter for supervision of normal pregnancy, unspecified, third trimester: Secondary | ICD-10-CM | POA: Insufficient documentation

## 2023-05-14 LAB — OB RESULTS CONSOLE VARICELLA ZOSTER ANTIBODY, IGG: Varicella: NON-IMMUNE/NOT IMMUNE

## 2023-05-14 LAB — OB RESULTS CONSOLE HEPATITIS B SURFACE ANTIGEN: Hepatitis B Surface Ag: NEGATIVE

## 2023-05-14 LAB — OB RESULTS CONSOLE RUBELLA ANTIBODY, IGM: Rubella: IMMUNE

## 2023-11-02 LAB — OB RESULTS CONSOLE RPR: RPR: NONREACTIVE

## 2023-11-02 LAB — OB RESULTS CONSOLE GC/CHLAMYDIA
Chlamydia: NEGATIVE
Neisseria Gonorrhea: NEGATIVE

## 2023-11-02 LAB — OB RESULTS CONSOLE GBS: GBS: NEGATIVE

## 2023-11-02 LAB — OB RESULTS CONSOLE HIV ANTIBODY (ROUTINE TESTING): HIV: NONREACTIVE

## 2023-11-14 NOTE — L&D Delivery Note (Addendum)
 L&D Note    Subjective:  Patient resting comfortably in bed. States pain is well managed with epidural.   Objective:   Vitals:   11/16/23 2215 11/16/23 2220 11/16/23 2225 11/16/23 2226  BP:    (!) 99/55  Pulse:    83  Resp:      Temp:      TempSrc:      SpO2: 100% 99% 99%   Weight:      Height:        Current Vital Signs 24h Vital Sign Ranges  T 98.5 F (36.9 C) Temp  Avg: 98.3 F (36.8 C)  Min: 98.1 F (36.7 C)  Max: 98.5 F (36.9 C)  BP (!) 99/55 BP  Min: 99/55  Max: 120/80  HR 83 Pulse  Avg: 89.6  Min: 79  Max: 109  RR 16 Resp  Avg: 16  Min: 16  Max: 16  SaO2 99 %   SpO2  Avg: 99.8 %  Min: 99 %  Max: 100 %      Gen: alert, cooperative, no distress FHR: Baseline: 130 bpm, Variability: moderate, Accels: Present, Decels: none Toco: regular, every 1-6 minutes SVE: Dilation: 8 Effacement (%): 90 Station: 0 Exam by:: D Cox RN  Medications SCHEDULED MEDICATIONS   oxytocin  40 units in LR 1000 mL  333 mL Intravenous Once   sodium chloride  flush  3 mL Intravenous Q12H    MEDICATION INFUSIONS   sodium chloride      fentaNYL  2 mcg/mL w/bupivacaine  0.125% in NS 250 mL 12 mL/hr (11/16/23 2138)   lactated ringers      lactated ringers      [START ON 11/17/2023] lactated ringers      lactated ringers      oxytocin       PRN MEDICATIONS  sodium chloride , acetaminophen , diphenhydrAMINE , ePHEDrine , ePHEDrine , fentaNYL  (SUBLIMAZE ) injection, fentaNYL  2 mcg/mL w/bupivacaine  0.125% in NS 250 mL, lactated ringers , lidocaine  (PF), ondansetron , ondansetron , phenylephrine , phenylephrine , sodium chloride  flush, sodium citrate-citric acid    Assessment & Plan:  23 y.o. G2P1001 at [redacted]w[redacted]d admitted for dilation of cervix of 7 cm. Patient in active labor.  -Labor: Active phase labor. -Fetal Well-being: Category I -GBS: negative -Membranes: intact, bulging bag  -Expectant management. -Analgesia: regional anesthesia   Jencarlos Nicolson, CNM  11/16/2023 11:05 PM  [facility]

## 2023-11-14 NOTE — L&D Delivery Note (Signed)
 Delivery Note  Aimee Sandoval is a G2P1001 at [redacted]w[redacted]d, dated by ultrasound on 04/17/2023 @ [redacted]w[redacted]d.   First Stage: Labor onset: started around 1720 on 11/16/2023 Augmentation: AROM  Analgesia /Anesthesia intrapartum: regional anesthesia  AROM at 0047, clear fluid  GBS: negative IP Antibiotics: None   Second Stage: Complete dilation at 0047 Onset of pushing at 0104 FHR second stage 130 bpm with moderate variability, variable decels with pushing   Aimee Sandoval presented to L&D for uterine contractions and cervical exam revealed dilation of cervix of 7cm. She was found to be in active labor. She progressed 10/100/0 with a spontaneous urge to push. She pushed  effectively over approximately 19 minutes for a spontaneous vaginal birth.  Delivery of a viable baby boy on 11/17/2023 at 0125 by CNM Delivery of fetal head in ROA position with restitution to ROT. No nuchal cord;  Anterior then posterior shoulders delivered easily with gentle downward traction. Baby placed on mom's chest, and attended to by baby RN Cord double clamped after cessation of pulsation, cut by FOB  T.Schermerhorn, MD at bedside to supervise delivery.   Cord blood sample collection: Yes Conflict (See Lab Report): O POS/O POS Performed at [facility], [address] [place], [place] [address]  Collection of cord blood donation: None  Arterial cord blood sample: None   Third Stage: Oxytocin  bolus started after delivery of infant for hemorrhage prophylaxis  Placenta delivered Keren intact with 3 VC @ 0138 Placenta disposition: Discarded  Uterine tone firm / bleeding minimal IV Pitocin  for hemorrhage prophylaxis  Periurethral laceration identified  Anesthesia used for repair: Regional anesthesia  Suture Repair: 3-0 Vicryl Est. Blood Loss (mL): 300  Complications: None   Mom to postpartum.  Baby to Couplet care / Skin to Skin.  Newborn: Information for the patient's newborn:  Ermalee, Mealy  [[id number]]  Live born female  Birth Weight:   APGAR: 8, 9  Newborn Delivery   Birth date/time: 11/17/2023 01:25:00 Delivery type: Vaginal, Spontaneous      Feeding planned: breast feeding  ---------- Aimee Sandoval CNM Certified Nurse Midwife [facility]  [facility] OB[facility]N [facility]

## 2023-11-16 ENCOUNTER — Inpatient Hospital Stay
Admission: EM | Admit: 2023-11-16 | Discharge: 2023-11-18 | DRG: 806 | Disposition: A | Discharge status: Home / Self Care | Payer: Medicaid Other

## 2023-11-16 ENCOUNTER — Encounter: Payer: Self-pay | Admitting: Obstetrics and Gynecology

## 2023-11-16 ENCOUNTER — Inpatient Hospital Stay: Payer: Medicaid Other | Admitting: Anesthesiology

## 2023-11-16 ENCOUNTER — Other Ambulatory Visit: Payer: Self-pay

## 2023-11-16 DIAGNOSIS — D62 Acute posthemorrhagic anemia: Secondary | ICD-10-CM | POA: Diagnosis not present

## 2023-11-16 DIAGNOSIS — O4290 Premature rupture of membranes, unspecified as to length of time between rupture and onset of labor, unspecified weeks of gestation: Principal | ICD-10-CM | POA: Diagnosis present

## 2023-11-16 DIAGNOSIS — O9081 Anemia of the puerperium: Secondary | ICD-10-CM | POA: Diagnosis not present

## 2023-11-16 DIAGNOSIS — O26893 Other specified pregnancy related conditions, third trimester: Secondary | ICD-10-CM | POA: Diagnosis present

## 2023-11-16 DIAGNOSIS — Z3A38 38 weeks gestation of pregnancy: Secondary | ICD-10-CM | POA: Diagnosis not present

## 2023-11-16 DIAGNOSIS — Z2839 Other underimmunization status: Secondary | ICD-10-CM

## 2023-11-16 DIAGNOSIS — Z3490 Encounter for supervision of normal pregnancy, unspecified, unspecified trimester: Secondary | ICD-10-CM

## 2023-11-16 LAB — TYPE AND SCREEN
ABO/RH(D): O POS
Antibody Screen: NEGATIVE

## 2023-11-16 LAB — CBC
HCT: 37.7 % (ref 36.0–46.0)
Hemoglobin: 12.7 g/dL (ref 12.0–15.0)
MCH: 28.9 pg (ref 26.0–34.0)
MCHC: 33.7 g/dL (ref 30.0–36.0)
MCV: 85.7 fL (ref 80.0–100.0)
Platelets: 168 K/uL (ref 150–400)
RBC: 4.4 MIL/uL (ref 3.87–5.11)
RDW: 13.7 % (ref 11.5–15.5)
WBC: 8.2 K/uL (ref 4.0–10.5)
nRBC: 0 % (ref 0.0–0.2)

## 2023-11-16 LAB — RUPTURE OF MEMBRANE (ROM)PLUS: Rom Plus: NEGATIVE

## 2023-11-16 LAB — ABO/RH: ABO/RH(D): O POS

## 2023-11-16 MED ORDER — ONDANSETRON 4 MG PO TBDP: 4.0000 mg | ORAL_TABLET | Freq: Four times a day (QID) | ORAL | Status: DC | PRN

## 2023-11-16 MED ORDER — PHENYLEPHRINE 80 MCG/ML (10ML) SYRINGE FOR IV PUSH (FOR BLOOD PRESSURE SUPPORT): 80.0000 ug | PREFILLED_SYRINGE | INTRAVENOUS | Status: DC | PRN

## 2023-11-16 MED ORDER — FENTANYL CITRATE (PF) 100 MCG/2ML IJ SOLN: 50.0000 ug | INTRAMUSCULAR | Status: DC | PRN

## 2023-11-16 MED ORDER — FENTANYL-BUPIVACAINE-NACL 0.5-0.125-0.9 MG/250ML-% EP SOLN
EPIDURAL | Status: AC
  Filled 2023-11-16: qty 250

## 2023-11-16 MED ORDER — LACTATED RINGERS IV SOLN
INTRAVENOUS | Status: DC
Start: 2023-11-16 — End: 2023-11-17

## 2023-11-16 MED ORDER — SODIUM CHLORIDE 0.9% FLUSH: 3.0000 mL | Freq: Two times a day (BID) | INTRAVENOUS | Status: DC

## 2023-11-16 MED ORDER — SODIUM CHLORIDE 0.9% FLUSH: 3.0000 mL | INTRAVENOUS | Status: DC | PRN

## 2023-11-16 MED ORDER — FENTANYL-BUPIVACAINE-NACL 0.5-0.125-0.9 MG/250ML-% EP SOLN
12.0000 mL/h | EPIDURAL | Status: DC | PRN
  Administered 2023-11-16: 12 mL/h via EPIDURAL

## 2023-11-16 MED ORDER — OXYTOCIN-SODIUM CHLORIDE 30-0.9 UT/500ML-% IV SOLN
2.5000 [IU]/h | INTRAVENOUS | Status: DC
  Administered 2023-11-17: 2.5 [IU]/h via INTRAVENOUS

## 2023-11-16 MED ORDER — ACETAMINOPHEN 500 MG PO TABS: 1000.0000 mg | ORAL_TABLET | Freq: Four times a day (QID) | ORAL | Status: DC | PRN

## 2023-11-16 MED ORDER — LACTATED RINGERS IV SOLN: 500.0000 mL | INTRAVENOUS | Status: DC | PRN

## 2023-11-16 MED ORDER — OXYTOCIN BOLUS FROM INFUSION
333.0000 mL | Freq: Once | INTRAVENOUS | Status: AC
  Administered 2023-11-17: 333 mL via INTRAVENOUS

## 2023-11-16 MED ORDER — DIPHENHYDRAMINE HCL 50 MG/ML IJ SOLN: 12.5000 mg | INTRAMUSCULAR | Status: DC | PRN

## 2023-11-16 MED ORDER — EPHEDRINE 5 MG/ML INJ: 10.0000 mg | INTRAVENOUS | Status: DC | PRN

## 2023-11-16 MED ORDER — LACTATED RINGERS IV SOLN: 500.0000 mL | Freq: Once | INTRAVENOUS | Status: DC

## 2023-11-16 MED ORDER — LACTATED RINGERS IV SOLN: 125.0000 mL/h | INTRAVENOUS | Status: DC

## 2023-11-16 MED ORDER — SOD CITRATE-CITRIC ACID 500-334 MG/5ML PO SOLN: 30.0000 mL | ORAL | Status: DC | PRN

## 2023-11-16 MED ORDER — LIDOCAINE HCL (PF) 1 % IJ SOLN
INTRAMUSCULAR | Status: DC | PRN
  Administered 2023-11-16: 3 mL via SUBCUTANEOUS

## 2023-11-16 MED ORDER — LIDOCAINE-EPINEPHRINE (PF) 1.5 %-1:200000 IJ SOLN
INTRAMUSCULAR | Status: DC | PRN
  Administered 2023-11-16: 3 mL via EPIDURAL

## 2023-11-16 MED ORDER — SODIUM CHLORIDE 0.9 % IV SOLN
INTRAVENOUS | Status: DC | PRN
  Administered 2023-11-16: 7 mL via EPIDURAL

## 2023-11-16 MED ORDER — SODIUM CHLORIDE 0.9 % IV SOLN: 250.0000 mL | INTRAVENOUS | Status: DC | PRN

## 2023-11-16 MED ORDER — ONDANSETRON HCL 4 MG/2ML IJ SOLN: 4.0000 mg | Freq: Four times a day (QID) | INTRAMUSCULAR | Status: DC | PRN

## 2023-11-16 MED ORDER — LIDOCAINE HCL (PF) 1 % IJ SOLN
30.0000 mL | INTRAMUSCULAR | Status: DC | PRN
  Filled 2023-11-16: qty 30

## 2023-11-16 NOTE — OB Triage Note (Signed)
 23 y.o G2P1 presents to L&D triage w/ c/o LOF and ctx's. She says that she felt a big gush around 1720 this evening and rates her ctx's a 6/10. She reports nausea but denies vomiting. Pt endorses fetal movement, denies vaginal bleeding. ROM+ has been collected, monitors applied and assessing. [place], CNM aware of pt arrival.

## 2023-11-16 NOTE — Anesthesia Preprocedure Evaluation (Signed)
 Anesthesia Evaluation  Patient identified by MRN, date of birth, ID band Patient awake    Reviewed: Allergy & Precautions, NPO status , Patient's Chart, lab work & pertinent test results  History of Anesthesia Complications Negative for: history of anesthetic complications  Airway Mallampati: II  TM Distance: >3 FB Neck ROM: Full    Dental no notable dental hx. (+) Teeth Intact   Pulmonary neg pulmonary ROS, neg sleep apnea, neg COPD, Patient abstained from smoking.Not current smoker   Pulmonary exam normal breath sounds clear to auscultation       Cardiovascular Exercise Tolerance: Good METS(-) hypertension(-) CAD and (-) Past MI negative cardio ROS (-) dysrhythmias  Rhythm:Regular Rate:Normal - Systolic murmurs    Neuro/Psych negative neurological ROS  negative psych ROS   GI/Hepatic ,neg GERD  ,,(+)     (-) substance abuse    Endo/Other  neg diabetes    Renal/GU negative Renal ROS     Musculoskeletal   Abdominal   Peds  Hematology   Anesthesia Other Findings History reviewed. No pertinent past medical history.  Reproductive/Obstetrics (+) Pregnancy                             Anesthesia Physical Anesthesia Plan  ASA: 2  Anesthesia Plan: Epidural   Post-op Pain Management:    Induction:   PONV Risk Score and Plan: 2 and Treatment may vary due to age or medical condition and Ondansetron   Airway Management Planned: Natural Airway  Additional Equipment:   Intra-op Plan:   Post-operative Plan:   Informed Consent: I have reviewed the patients History and Physical, chart, labs and discussed the procedure including the risks, benefits and alternatives for the proposed anesthesia with the patient or authorized representative who has indicated his/her understanding and acceptance.       Plan Discussed with: Surgeon  Anesthesia Plan Comments: (Discussed R/B/A of neuraxial  anesthesia technique with patient: - rare risks of spinal/epidural hematoma, nerve damage, infection - Risk of PDPH - Risk of itching - Risk of nausea and vomiting - Risk of poor block necessitating replacement of epidural. - Risk of allergic reactions. Patient voiced understanding.)       Anesthesia Quick Evaluation

## 2023-11-16 NOTE — H&P (Addendum)
 OB History & Physical   History of Present Illness:   Chief Complaint: uterine contractions and leakage of amniotic fluid   HPI:  Aimee Sandoval is a 23 y.o. G2P1001 female at [redacted]w[redacted]d, dated by ultrasound on 04/17/23 @ [redacted]w[redacted]d, , with Estimated Date of Delivery: 11/26/23.  She presents to L&D for for concerns of uterine contractions and leakage of amniotic fluid. Rates pain of contractions 6/10. Contracting every 1-4 minutes and cervical exam revealed dilation of cervix of 7 cm. Patient found to be in active labor.   Reports active fetal movement  Contractions: started around 1720 on 11/16/2023 LOF/SROM: Intact  Vaginal bleeding: None   Factors complicating pregnancy:  Subchorionic hemorrhage (resolved on 07/11/2023) UTI Varicella Non-Immune  Patient Active Problem List   Diagnosis Date Noted   Leakage of amniotic fluid 11/16/2023   Dilation of cervix of 7 cm 11/16/2023   NSVD (normal spontaneous vaginal delivery) 04/28/2022   Encounter for supervision of normal pregnancy in third trimester 09/13/2021    Prenatal Transfer Tool  Maternal Diabetes: No Genetic Screening: Normal Maternal Ultrasounds/Referrals: None Fetal Ultrasounds or other Referrals:  None Maternal Substance Abuse:  No Significant Maternal Medications:  None Significant Maternal Lab Results: Group B Strep negative  Maternal Medical History:  History reviewed. No pertinent past medical history.  History reviewed. No pertinent surgical history.  No Known Allergies  Prior to Admission medications   Medication Sig Start Date End Date Taking? Authorizing Provider  acetaminophen  (TYLENOL ) 325 MG tablet Take 2 tablets (650 mg total) by mouth every 4 (four) hours as needed (for pain scale < 4). 04/27/22  Yes Wilson, Edsel Fuller, CNM  benzocaine -Menthol  (DERMOPLAST) 20-0.5 % AERO Apply 1 Application topically as needed for irritation (perineal discomfort). 04/27/22  Yes Tanda Edsel Fuller, CNM  ferrous sulfate   325 (65 FE) MG tablet Take 1 tablet (325 mg total) by mouth 2 (two) times daily with a meal. 04/27/22  Yes Wilson, Edsel Fuller, CNM  ibuprofen  (ADVIL ) 600 MG tablet Take 1 tablet (600 mg total) by mouth every 6 (six) hours as needed. 09/13/20  Yes Bernardino Ditch, NP  Prenatal Vit-Fe Fumarate-FA (PRENATAL MULTIVITAMIN) TABS tablet Take 1 tablet by mouth daily at 12 noon. 04/27/22  Yes Tanda Edsel Fuller, CNM  witch hazel-glycerin  (TUCKS) pad Apply 1 Application topically as needed for hemorrhoids. 04/27/22  Yes Tanda Edsel Fuller, CNM     Prenatal care site:  [facility]  OB History  Gravida Para Term Preterm AB Living  2 1 1  0 0 1  SAB IAB Ectopic Multiple Live Births  0 0 0 0 1    # Outcome Date GA Lbr Len/2nd Weight Sex Type Anes PTL Lv  2 Current           1 Term 04/26/22 [redacted]w[redacted]d 22:04 / 00:30 3190 g F Vag-Spont EPI  LIV     Name: Aimee Sandoval, Aimee Sandoval     Apgar1: 8  Apgar5: 9     Social History: She  reports that she has never smoked. She has never used smokeless tobacco. She reports that she does not drink alcohol and does not use drugs.  Family History: family history includes Healthy in her father and mother.   Review of Systems: A full review of systems was performed and negative except as noted in the HPI.     Physical Exam:  Vital Signs: BP 120/80 (BP Location: Right Arm)   Pulse 86   Temp 98.1 F (36.7 C) (Oral)  Resp 16   Ht 5' 1 (1.549 m)   Wt 61.2 kg   LMP  (Exact Date)   BMI 25.51 kg/m   General: no acute distress.  HEENT: normocephalic, atraumatic Heart: regular rate & rhythm Lungs: normal respiratory effort Abdomen: soft, gravid, non-tender Pelvic:   External: Normal external female genitalia  Cervix: Dilation: 7 / Effacement (%): 50 / Station: -2    Extremities: non-tender, symmetric, no edema bilaterally.  DTRs: 2+  Neurologic: Alert & oriented x 3.    Results for orders placed or performed during the hospital encounter of  11/16/23 (from the past 24 hours)  Rupture of Membrane (ROM) Plus     Status: None   Collection Time: 11/16/23  6:30 PM  Result Value Ref Range   Rom Plus NEGATIVE   CBC     Status: None   Collection Time: 11/16/23  7:26 PM  Result Value Ref Range   WBC 8.2 4.0 - 10.5 K/uL   RBC 4.40 3.87 - 5.11 MIL/uL   Hemoglobin 12.7 12.0 - 15.0 g/dL   HCT 62.2 63.9 - 53.9 %   MCV 85.7 80.0 - 100.0 fL   MCH 28.9 26.0 - 34.0 pg   MCHC 33.7 30.0 - 36.0 g/dL   RDW 86.2 88.4 - 84.4 %   Platelets 168 150 - 400 K/uL   nRBC 0.0 0.0 - 0.2 %  Type and screen [facility]     Status: None   Collection Time: 11/16/23  7:26 PM  Result Value Ref Range   ABO/RH(D) O POS    Antibody Screen NEG    Sample Expiration      11/19/2023,2359 Performed at [facility], [address], [place], [place] [address]   ABO/Rh     Status: None   Collection Time: 11/16/23  7:26 PM  Result Value Ref Range   ABO/RH(D)      O POS Performed at [facility], [address] [place], [place] [address]     Pertinent Results:  Prenatal Labs: Blood type/Rh Conflict (See Lab Report): O POS/O POS Performed at [facility], [address] [place], [place] [address]    Antibody screen Negative    Rubella Immune (07/01 0000)   Varicella Not immune  RPR Nonreactive (12/20 0000)   HBsAg Negative (07/01 0000)  Hep C NR   HIV Non-reactive (12/20 0000)   GC Negative  Chlamydia Negative   Genetic screening cfDNA negative   1 hour GTT 58  3 hour GTT N/A  GBS Negative/-- (12/20 0000)    FHT:  FHR: 130 bpm, variability: moderate,  accelerations:  Present,  decelerations:  Absent Category/reactivity:  Category I UC:   regular, every 1-4 minutes   Cephalic by SVE   Assessment:  Aimee Sandoval is a 23 y.o. G29P1001 female at [redacted]w[redacted]d admitted for dilation of cervix of 7 cm. Patient in active labor.    Plan:  1. Admit to Labor & Delivery - Consents reviewed and  obtained - T. Schermerhorn, MD notified of admission and plan of care   2. Fetal Well being  - Fetal Tracing: Category I  - Group B Streptococcus ppx not indicated: GBS negative - Presentation: Cephalic confirmed by SVE   3. Routine OB: - Prenatal labs reviewed, as above - Rh positive - CBC, T&S, RPR on admit - Clear liquid diet , continuous IV fluids  4. Monitoring of labor  - Contractions monitored with external toco -  Pelvis adequate for trial of labor  - Plan for expectant management  - Plan for  continuous fetal monitoring - Maternal pain control as desired; planning regional anesthesia - Anticipate vaginal delivery  5. ROM  - Membranes: Intact  - ROM Plus collected (11/16/2023); Results: Negative   6. Post Partum Planning: - Infant feeding: breast feeding - Contraception:  Undecided  - Flu vaccine: Given prenatally on 09/21/2023 - Tdap vaccine: Given prenatally on 09/21/2023 - RSV vaccine: Given prenatally on 10/08/2023  Aimee Sandoval, CNM 11/16/23 9:38 PM  Aimee Sandoval, CNM Certified Nurse Midwife [place]  [facility]

## 2023-11-16 NOTE — Anesthesia Procedure Notes (Signed)
Epidural Patient location during procedure: OB  Staffing Anesthesiologist: Arita Miss, MD Performed: anesthesiologist   Preanesthetic Checklist Completed: patient identified, IV checked, site marked, risks and benefits discussed, surgical consent, monitors and equipment checked, pre-op evaluation and timeout performed  Epidural Patient position: sitting Prep: ChloraPrep Patient monitoring: heart rate, continuous pulse ox and blood pressure Approach: midline Location: L4-L5 Injection technique: LOR saline  Needle:  Needle type: Tuohy  Needle gauge: 17 G Needle length: 9 cm Needle insertion depth: 4 cm Catheter type: closed end flexible Catheter size: 19 Gauge Catheter at skin depth: 9 cm Test dose: negative and 1.5% lidocaine with Epi 1:200 K  Assessment Sensory level: T10 Events: blood not aspirated, no cerebrospinal fluid, injection not painful, no injection resistance, no paresthesia and negative IV test  Additional Notes First/one attempt Pt. Evaluated and documentation done after procedure finished. Patient identified. Risks/Benefits/Options discussed with patient including but not limited to bleeding, infection, nerve damage, paralysis, failed block, incomplete pain control, headache, blood pressure changes, nausea, vomiting, reactions to medication both or allergic, itching and postpartum back pain. Confirmed with bedside nurse the patient's most recent platelet count. Confirmed with patient that they are not currently taking any anticoagulation, have any bleeding history or any family history of bleeding disorders. Patient expressed understanding and wished to proceed. All questions were answered. Sterile technique was used throughout the entire procedure. Please see nursing notes for vital signs. Test dose was given through epidural catheter and negative prior to continuing to dose epidural or start infusion. Warning signs of high block given to the patient including  shortness of breath, tingling/numbness in hands, complete motor block, or any concerning symptoms with instructions to call for help. Patient was given instructions on fall risk and not to get out of bed. All questions and concerns addressed with instructions to call with any issues or inadequate analgesia.     Patient tolerated the insertion well without immediate complications.  Reason for block: procedure for painReason for block:procedure for pain

## 2023-11-17 ENCOUNTER — Encounter: Payer: Self-pay | Admitting: Obstetrics and Gynecology

## 2023-11-17 LAB — CBC
HCT: 31.8 % — ABNORMAL LOW (ref 36.0–46.0)
Hemoglobin: 10.8 g/dL — ABNORMAL LOW (ref 12.0–15.0)
MCH: 28.6 pg (ref 26.0–34.0)
MCHC: 34 g/dL (ref 30.0–36.0)
MCV: 84.1 fL (ref 80.0–100.0)
Platelets: 201 10*3/uL (ref 150–400)
RBC: 3.78 MIL/uL — ABNORMAL LOW (ref 3.87–5.11)
RDW: 13.9 % (ref 11.5–15.5)
WBC: 13.3 10*3/uL — ABNORMAL HIGH (ref 4.0–10.5)
nRBC: 0 % (ref 0.0–0.2)

## 2023-11-17 LAB — RPR: RPR Ser Ql: NONREACTIVE

## 2023-11-17 MED ORDER — SIMETHICONE 80 MG PO CHEW: 80.0000 mg | CHEWABLE_TABLET | ORAL | Status: DC | PRN

## 2023-11-17 MED ORDER — BENZOCAINE-MENTHOL 20-0.5 % EX AERO
1.0000 | INHALATION_SPRAY | CUTANEOUS | Status: DC | PRN
  Administered 2023-11-17: 1 via TOPICAL
  Filled 2023-11-17: qty 56

## 2023-11-17 MED ORDER — MISOPROSTOL 200 MCG PO TABS
ORAL_TABLET | ORAL | Status: AC
  Filled 2023-11-17: qty 1

## 2023-11-17 MED ORDER — ACETAMINOPHEN 500 MG PO TABS
1000.0000 mg | ORAL_TABLET | Freq: Four times a day (QID) | ORAL | Status: DC
  Administered 2023-11-17 – 2023-11-18 (×5): 1000 mg via ORAL
  Filled 2023-11-17 (×7): qty 2

## 2023-11-17 MED ORDER — FERROUS SULFATE 325 (65 FE) MG PO TABS
325.0000 mg | ORAL_TABLET | Freq: Two times a day (BID) | ORAL | Status: DC
  Administered 2023-11-17 – 2023-11-18 (×3): 325 mg via ORAL
  Filled 2023-11-17 (×3): qty 1

## 2023-11-17 MED ORDER — WITCH HAZEL-GLYCERIN EX PADS
1.0000 | MEDICATED_PAD | CUTANEOUS | Status: DC | PRN
  Administered 2023-11-17: 1 via TOPICAL
  Filled 2023-11-17: qty 100

## 2023-11-17 MED ORDER — DIPHENHYDRAMINE HCL 25 MG PO CAPS: 25.0000 mg | ORAL_CAPSULE | Freq: Four times a day (QID) | ORAL | Status: DC | PRN

## 2023-11-17 MED ORDER — AMMONIA AROMATIC IN INHA
RESPIRATORY_TRACT | Status: AC
  Filled 2023-11-17: qty 10

## 2023-11-17 MED ORDER — SODIUM CHLORIDE 0.9% FLUSH: 3.0000 mL | INTRAVENOUS | Status: DC | PRN

## 2023-11-17 MED ORDER — PRENATAL MULTIVITAMIN CH
1.0000 | ORAL_TABLET | Freq: Every day | ORAL | Status: DC
  Administered 2023-11-17 – 2023-11-18 (×2): 1 via ORAL
  Filled 2023-11-17 (×2): qty 1

## 2023-11-17 MED ORDER — OXYTOCIN 10 UNIT/ML IJ SOLN
INTRAMUSCULAR | Status: AC
  Filled 2023-11-17: qty 2

## 2023-11-17 MED ORDER — DOCUSATE SODIUM 100 MG PO CAPS
100.0000 mg | ORAL_CAPSULE | Freq: Two times a day (BID) | ORAL | Status: DC
  Administered 2023-11-18: 100 mg via ORAL
  Filled 2023-11-17: qty 1

## 2023-11-17 MED ORDER — COCONUT OIL OIL: 1.0000 | TOPICAL_OIL | Status: DC | PRN

## 2023-11-17 MED ORDER — VARICELLA VIRUS VACCINE LIVE 1350 PFU/0.5ML IJ SUSR: 0.5000 mL | INTRAMUSCULAR | Status: DC | PRN

## 2023-11-17 MED ORDER — OXYTOCIN-SODIUM CHLORIDE 30-0.9 UT/500ML-% IV SOLN
INTRAVENOUS | Status: AC
  Filled 2023-11-17: qty 500

## 2023-11-17 MED ORDER — IBUPROFEN 600 MG PO TABS
600.0000 mg | ORAL_TABLET | Freq: Four times a day (QID) | ORAL | Status: DC
  Administered 2023-11-17 – 2023-11-18 (×5): 600 mg via ORAL
  Filled 2023-11-17 (×6): qty 1

## 2023-11-17 MED ORDER — SODIUM CHLORIDE 0.9% FLUSH: 3.0000 mL | Freq: Two times a day (BID) | INTRAVENOUS | Status: DC

## 2023-11-17 MED ORDER — SODIUM CHLORIDE 0.9 % IV SOLN: 250.0000 mL | INTRAVENOUS | Status: DC | PRN

## 2023-11-17 MED ORDER — DIBUCAINE (PERIANAL) 1 % EX OINT
1.0000 | TOPICAL_OINTMENT | CUTANEOUS | Status: DC | PRN
  Administered 2023-11-17: 1 via RECTAL
  Filled 2023-11-17: qty 28

## 2023-11-17 NOTE — Progress Notes (Signed)
 Patient ID: Aimee Sandoval, female   DOB: [birth year], 22 y.o.   MRN: [id number] Supervision and  assistance of CNM Liboon  of SVD. Uncomplicated yielding a Female infant . Apgars 8/9 Placenta delivered 12 minutes after  with intact membranes . IVP pitocin  administered . Good hemostasis . Crede maneuver no additional clots.  Cervix intact . Periurethral laceration repaired with 000 vicryl .  No complications

## 2023-11-17 NOTE — Progress Notes (Signed)
 L&D Note    Subjective:  Patient resting comfortably in bed. States she feels like she has to poop.  Objective:   Vitals:   11/16/23 2305 11/16/23 2315 11/16/23 2320 11/16/23 2325  BP:      Pulse:      Resp:      Temp:      TempSrc:      SpO2: 97% 98% 100% 100%  Weight:      Height:        Current Vital Signs 24h Vital Sign Ranges  T 98.5 F (36.9 C) Temp  Avg: 98.3 F (36.8 C)  Min: 98.1 F (36.7 C)  Max: 98.5 F (36.9 C)  BP 118/76 BP  Min: 99/55  Max: 120/80  HR 82 Pulse  Avg: 89.8  Min: 79  Max: 109  RR 16 Resp  Avg: 16  Min: 16  Max: 16  SaO2 100 %   SpO2  Avg: 99.5 %  Min: 97 %  Max: 100 %      Gen: alert, cooperative, no distress FHR: Baseline: 130 bpm, Variability: moderate, Accels: Present, Decels: none Toco: regular, every 1-3 minutes SVE: Dilation: 10 Dilation Complete Date: 11/17/23 Dilation Complete Time: 0047 Effacement (%): 100 Station: 0 Exam by:: Lazarius Rivkin CNM  Medications SCHEDULED MEDICATIONS   oxytocin  40 units in LR 1000 mL  333 mL Intravenous Once   sodium chloride  flush  3 mL Intravenous Q12H    MEDICATION INFUSIONS   sodium chloride      fentaNYL  2 mcg/mL w/bupivacaine  0.125% in NS 250 mL 12 mL/hr (11/16/23 2138)   lactated ringers      lactated ringers      lactated ringers      lactated ringers      oxytocin       PRN MEDICATIONS  sodium chloride , acetaminophen , diphenhydrAMINE , ePHEDrine , ePHEDrine , fentaNYL  (SUBLIMAZE ) injection, fentaNYL  2 mcg/mL w/bupivacaine  0.125% in NS 250 mL, lactated ringers , lidocaine  (PF), ondansetron , ondansetron , phenylephrine , phenylephrine , sodium chloride  flush, sodium citrate-citric acid    Assessment & Plan:  23 y.o. G2P1001 at [redacted]w[redacted]d admitted for dilation of cervix of 7 cm. Patient in active labor.  -Labor: Satisfactory labor progress. Patient SVE: 10/100/0. AROM discussed with patient. Patient verbalized understanding and   agreed to AROM.  -Fetal Well-being: Category I -GBS: negative -Membranes: AROM  @ 0047, clear fluid  -Expectant management and perform maternal position changes as indicated.  -Analgesia: regional anesthesia   Ethanjames Fontenot, CNM  11/17/2023 12:50 AM  [facility]

## 2023-11-17 NOTE — Progress Notes (Signed)
 Postpartum Day  0  Subjective: 23 y.o. H7E7997 postpartum day #0 status post normal spontaneous vaginal delivery. She is ambulating, is tolerating po, is voiding spontaneously.  Her pain is well controlled on PO pain medications. Her lochia is less than menses.  Objective: BP (!) 88/55   Pulse 78   Temp 98.5 F (36.9 C) (Oral)   Resp 18   Ht 5' 1 (1.549 m)   Wt 61.2 kg   LMP  (Exact Date)   SpO2 100%   Breastfeeding Unknown   BMI 25.51 kg/m    Physical Exam:  General: alert, cooperative, and no distress Breasts: soft/nontender Pulm: nl effort Abdomen: soft, non-tender, active bowel sounds Uterine Fundus: firm Perineum: minimal edema, repair well approximated Lochia: appropriate DVT Evaluation: No evidence of DVT seen on physical exam.  Recent Labs    11/16/23 1926 11/17/23 0451  HGB 12.7 10.8*  HCT 37.7 31.8*  WBC 8.2 13.3*  PLT 168 201    Assessment/Plan: 22 y.o. G2P2002 postpartum day # 0  1. Continue routine postpartum care  2. Infant feeding status: breast feeding -Lactation consult PRN for breastfeeding   3. Contraception plan: condoms  4. Acute blood loss anemia - clinically significant.  -Hemodynamically stable and asymptomatic -Intervention: start on oral supplementation with ferrous sulfate  325 mg   5. Immunization status:   needs Varicella prior to discharge   Disposition: continue inpatient postpartum care    LOS: 1 day   Therisa CHRISTELLA Pillow, CNM 11/17/2023, 9:31 AM   ----- Therisa Pillow  Certified Nurse Midwife [facility]

## 2023-11-17 NOTE — Discharge Instructions (Signed)
 Discharge Instructions:   If there are any new medications, they have been ordered and will be available for pickup at the listed pharmacy on your way home from the hospital.   Call office if you have any of the following: headache, visual changes, fever >101.0 F, chills, shortness of breath, breast concerns, excessive vaginal bleeding, incision drainage or problems, leg pain or redness, depression or any other concerns. If you have vaginal discharge with an odor, let your doctor know.   It is normal to bleed for up to 6 weeks. You should not soak through more than 1 pad in 1 hour. If you have a blood clot larger than your fist with continued bleeding, call your doctor.   Activity: Do not lift > 10 lbs for 6 weeks (do not lift anything heavier than your baby). No intercourse, tampons, swimming pools, hot tubs, baths (only showers) for 6 weeks.  No driving for 1-2 weeks. Continue prenatal vitamin, especially if breastfeeding. Increase calories and fluids (water) while breastfeeding.   Your milk will come in, in the next couple of days (right now it is colostrum). You may have a slight fever when your milk comes in, but it should go away on its own.  If it does not, and rises above 101 F please call the doctor. You will also feel achy and your breasts will be firm. They will also start to leak. If you are breastfeeding, continue as you have been and you can pump/express milk for comfort.   If you have too much milk, your breasts can become engorged, which could lead to mastitis. This is an infection of the milk ducts. It can be very painful and you will need to notify your doctor to obtain a prescription for antibiotics. You can also treat it with a shower or hot/cold compress.   For concerns about your baby, please call your pediatrician.  For breastfeeding concerns, the lactation consultant can be reached at [phone].   Postpartum blues (feelings of happy one minute and sad another minute)  are normal for the first few weeks but if it gets worse let your doctor know.   Congratulations! We enjoyed caring for you and your new bundle of joy!

## 2023-11-17 NOTE — Discharge Summary (Signed)
 Postpartum Discharge Summary  Patient Name: Aimee Sandoval DOB: Apr 05, 2001 MRN: [id number]  Date of admission: 11/16/2023 Delivery date:11/17/2023 Delivering provider: LIBOON, JAZMINE Date of discharge: 11/18/2023  Primary OB: [facility] LMP:No LMP recorded (exact date). EDC Estimated Date of Delivery: 11/26/23 Gestational Age at Delivery: [redacted]w[redacted]d   Admitting diagnosis: Leakage of amniotic fluid [O42.90] Dilation of cervix of 6 cm [Z34.90] Intrauterine pregnancy: [redacted]w[redacted]d     Secondary diagnosis:   Principal Problem:   Leakage of amniotic fluid Active Problems:   NSVD (normal spontaneous vaginal delivery)   Dilation of cervix of 7 cm   Maternal varicella, non-immune   Discharge Diagnosis: Term Pregnancy Delivered      Hospital course: Onset of Labor With Vaginal Delivery      23 y.o. yo H7E7997 at [redacted]w[redacted]d was admitted in Active Labor on 11/16/2023. Labor course was uncomplicated.  Membrane Rupture Time/Date: 12:47 AM,11/17/2023  Delivery Method:Vaginal, Spontaneous Operative Delivery:N/A Episiotomy: None Lacerations:  PeriurethralPeriurethral laceration  Patient had a postpartum course complicated by none.  She is ambulating, tolerating a regular diet, passing flatus, and urinating well. Patient is discharged home in stable condition on 11/18/23.  Newborn Data: Birth date:11/17/2023 Birth time:1:25 AM Gender:Female Living status:Living Apgars:8 ,9  Weight:3120 g                                            Post partum procedures: none Augmentation: AROM Complications: None Delivery Type: spontaneous vaginal delivery Anesthesia: epidural anesthesia Placenta: spontaneous To Pathology: No   Prenatal Labs:  Blood type/Rh Conflict (See Lab Report): O POS/O POS Performed at [facility], [address] [place], [place] [address]    Antibody screen Negative    Rubella Immune (07/01 0000)   Varicella Not immune  RPR Nonreactive (12/20 0000)   HBsAg Negative  (07/01 0000)  Hep C NR   HIV Non-reactive (12/20 0000)   GC Negative  Chlamydia Negative   Genetic screening cfDNA negative   1 hour GTT 58  3 hour GTT N/A  GBS Negative/-- (12/20 0000)     Magnesium Sulfate received: No BMZ received: No Rhophylac:was not indicated MMR: was not indicated Varivax vaccine given: was offered prior to discharge  - Tdap vaccine: Given prenatally on 09/21/2023  - Flu vaccine: Given prenatally on 09/21/2023  -RSV vaccine: Given prenatally on 10/08/2023   Transfusion:No  Physical exam  Vitals:   11/17/23 0815 11/17/23 1500 11/17/23 2330 11/18/23 0714  BP: (!) 88/55 104/63 100/69 103/68  Pulse:  80 81 95  Resp:  20 18 18   Temp:  98.6 F (37 C) 98 F (36.7 C) 97.7 F (36.5 C)  TempSrc:  Oral Oral Oral  SpO2:  100% 97% 100%  Weight:      Height:       General: alert, cooperative, and no distress Lochia: appropriate Uterine Fundus: firm Perineum:minimal edema/repair well approximated DVT Evaluation: No evidence of DVT seen on physical exam.  Labs: Lab Results  Component Value Date   WBC 13.3 (H) 11/17/2023   HGB 10.8 (L) 11/17/2023   HCT 31.8 (L) 11/17/2023   MCV 84.1 11/17/2023   PLT 201 11/17/2023       No data to display         Edinburgh Score:    11/17/2023    9:30 PM  Edinburgh Postnatal Depression Scale Screening Tool  I have  been able to laugh and see the funny side of things. --    Risk assessment for postpartum VTE and prophylactic treatment: Very high risk factors: None High risk factors: None Moderate risk factors: None  Postpartum VTE prophylaxis with LMWH not indicated  After visit meds:  Allergies as of 11/18/2023   No Known Allergies      Medication List     TAKE these medications    acetaminophen  325 MG tablet Commonly known as: Tylenol  Take 2 tablets (650 mg total) by mouth every 4 (four) hours as needed (for pain scale < 4).   ferrous sulfate  325 (65 FE) MG tablet Take 1 tablet (325 mg total)  by mouth every Monday, Wednesday, and Friday. Start taking on: November 19, 2023   ibuprofen  600 MG tablet Commonly known as: ADVIL  Take 1 tablet (600 mg total) by mouth every 6 (six) hours as needed for mild pain (pain score 1-3) or cramping.   prenatal multivitamin Tabs tablet Take 1 tablet by mouth daily at 12 noon.       Discharge home in stable condition Infant Feeding: Breast Infant Disposition:home with mother Discharge instruction: per After Visit Summary and Postpartum booklet. Activity: Advance as tolerated. Pelvic rest for 6 weeks.  Diet: routine diet Anticipated Birth Control:  Undecided  Postpartum Appointment:6 weeks Additional Postpartum F/U:  None Follow up Visit:  Follow-up Information     Liboon, Jazmine, CNM Follow up in 6 week(s).   Specialty: Certified Nurse Midwife Why: postpartum visit Contact information: [address] [place] [address] [phone]                 Plan:  Aimee Sandoval was discharged to home in good condition. Follow-up appointment as directed.    Signed:  Therisa Pillow, CNM Certified Nurse Midwife [facility]

## 2023-11-18 DIAGNOSIS — Z2839 Other underimmunization status: Secondary | ICD-10-CM

## 2023-11-18 MED ORDER — FERROUS SULFATE 325 (65 FE) MG PO TABS: 325.0000 mg | ORAL_TABLET | ORAL | Status: AC

## 2023-11-18 MED ORDER — IBUPROFEN 600 MG PO TABS: 600.0000 mg | ORAL_TABLET | Freq: Four times a day (QID) | ORAL | 0 refills | Status: AC | PRN

## 2023-11-18 NOTE — Lactation Note (Signed)
 This note was copied from a baby's chart. Lactation Consultation Note  Patient Name: Aimee Sandoval Unijb'd Date: 11/18/2023 Age:23 hours Reason for consult: Initial assessment;Early term 37-38.6wks   Maternal Data Initial assessment w/ a 33hr old baby Aimee.  Patient stated that her feeding goal is formula feeding.    Feeding Mother's Current Feeding Choice: Formula Nipple Type: Slow - flow  Interventions Interventions: Education  LC provided patient w/ a handout on lactation suppression and reviewed it with patient.  Discharge Discharge Education: Engorgement and breast care;Outpatient recommendation  Encouraged mom to reach out to lactation if she had more questions on breast care once home.  Consult Status Consult Status: Complete    Ricky RAMAN Jaquelyn Sakamoto 11/18/2023, 10:56 AM

## 2023-11-18 NOTE — Progress Notes (Signed)
 Discharge instructions reviewed with patient and family.  Questions answered and follow up care reviewed.  Printed copies given to patient for reference after discharge home.

## 2023-11-20 NOTE — Anesthesia Postprocedure Evaluation (Signed)
 Anesthesia Post Note  Patient: Aimee Sandoval  Procedure(s) Performed: AN AD HOC LABOR EPIDURAL  Anesthesia Type: Epidural Vital Signs Assessment: post-procedure vital signs reviewed and stable Respiratory status: respiratory function stable Cardiovascular status: stable Postop Assessment: no headache and no backache Anesthetic complications: no Comments: Patient discharged prior to post anesthesia evaluation.   No notable events documented.   Last Vitals: There were no vitals filed for this visit.  Last Pain: There were no vitals filed for this visit.               Rome Ade

## 2024-08-23 ENCOUNTER — Ambulatory Visit: Payer: Self-pay

## 2024-08-23 ENCOUNTER — Encounter: Payer: Self-pay | Admitting: Emergency Medicine

## 2024-08-23 ENCOUNTER — Ambulatory Visit
Admission: EM | Admit: 2024-08-23 | Discharge: 2024-08-23 | Disposition: A | Discharge status: Home / Self Care | Attending: Emergency Medicine | Admitting: Emergency Medicine

## 2024-08-23 DIAGNOSIS — Z3201 Encounter for pregnancy test, result positive: Secondary | ICD-10-CM

## 2024-08-23 LAB — POCT URINE PREGNANCY: Preg Test, Ur: POSITIVE — AB

## 2024-08-23 NOTE — Discharge Instructions (Addendum)
 Your pregnancy test today is positive.   Your will need evaluation from a women's health provider to determine gestational age( how far along you are) and estimated due date.    Please notify your obstetrician at [facility] clinic for prenatal care  you may start taking prenatal vitamin daily.   If you do, please stop drinking alcohol or smoking nicotine substances (cigarettes, vaping and hookah)  please stop any illicit drug use including marijuana, if this is a struggle for you, please notify for your women's health providers so they may help you   Inside your packet is a list of medications that are safe for you to use.    Any point if you begin to have abdominal cramping and/or vaginal bleeding please go to the nearest emergency department for immediate evaluation as unfortunately these can be signs of a miscarriage

## 2024-08-23 NOTE — ED Triage Notes (Signed)
 Patient here confirmation of pregnancy. Took test at home test was positive.

## 2024-08-23 NOTE — ED Provider Notes (Signed)
 [facility]AL[facility]ELT    CSN: [id number] Arrival date & time: 08/23/24  1422      History   Chief Complaint Chief Complaint  Patient presents with   Possible Pregnancy    HPI Aimee Sandoval is a 23 y.o. female.   Patient presents for confirmation of pregnancy.  Has been experiencing nausea without vomiting and fatigue for 3 days.  Missed menses for October, last menstruation beginning on 07/14/2024.  Took home test which was positive.  History reviewed. No pertinent past medical history.  Patient Active Problem List   Diagnosis Date Noted   Maternal varicella, non-immune 11/18/2023   Leakage of amniotic fluid 11/16/2023   Dilation of cervix of 7 cm 11/16/2023   Encounter for supervision of normal pregnancy in third trimester 04/18/2023   NSVD (normal spontaneous vaginal delivery) 04/28/2022    History reviewed. No pertinent surgical history.  OB History     Gravida  3   Para  2   Term  2   Preterm      AB      Living  2      SAB      IAB      Ectopic      Multiple  0   Live Births  2            Home Medications    Prior to Admission medications   Medication Sig Start Date End Date Taking? Authorizing Provider  acetaminophen  (TYLENOL ) 325 MG tablet Take 2 tablets (650 mg total) by mouth every 4 (four) hours as needed (for pain scale < 4). 04/27/22   Tanda Edsel Fuller, CNM  ferrous sulfate  325 (65 FE) MG tablet Take 1 tablet (325 mg total) by mouth every Monday, Wednesday, and Friday. 11/19/23   Vernel Therisa HERO, CNM  ibuprofen  (ADVIL ) 600 MG tablet Take 1 tablet (600 mg total) by mouth every 6 (six) hours as needed for mild pain (pain score 1-3) or cramping. 11/18/23   Vernel Therisa HERO, CNM  Prenatal Vit-Fe Fumarate-FA (PRENATAL MULTIVITAMIN) TABS tablet Take 1 tablet by mouth daily at 12 noon. 04/27/22   Tanda Edsel Fuller, CNM    Family History Family History  Problem Relation Age of Onset   Healthy Mother    Healthy Father      Social History Social History   Tobacco Use   Smoking status: Never   Smokeless tobacco: Never  Vaping Use   Vaping status: Never Used  Substance Use Topics   Alcohol use: Never   Drug use: Never     Allergies   Patient has no known allergies.   Review of Systems Review of Systems   Physical Exam Triage Vital Signs ED Triage Vitals  Encounter Vitals Group     BP 08/23/24 1434 113/62     Girls Systolic BP Percentile --      Girls Diastolic BP Percentile --      Boys Systolic BP Percentile --      Boys Diastolic BP Percentile --      Pulse Rate 08/23/24 1434 100     Resp 08/23/24 1434 18     Temp 08/23/24 1434 98.6 F (37 C)     Temp Source 08/23/24 1434 Oral     SpO2 08/23/24 1434 97 %     Weight --      Height --      Head Circumference --      Peak Flow --  Pain Score 08/23/24 1435 0     Pain Loc --      Pain Education --      Exclude from Growth Chart --    No data found.  Updated Vital Signs BP 113/62 (BP Location: Left Arm)   Pulse 100   Temp 98.6 F (37 C) (Oral)   Resp 18   LMP 07/14/2024 (Exact Date)   SpO2 97%   Breastfeeding No   Visual Acuity Right Eye Distance:   Left Eye Distance:   Bilateral Distance:    Right Eye Near:   Left Eye Near:    Bilateral Near:     Physical Exam Constitutional:      Appearance: Normal appearance.  Eyes:     Extraocular Movements: Extraocular movements intact.  Pulmonary:     Effort: Pulmonary effort is normal.  Neurological:     Mental Status: She is alert and oriented to person, place, and time.      UC Treatments / Results  Labs (all labs ordered are listed, but only abnormal results are displayed) Labs Reviewed  POCT URINE PREGNANCY - Abnormal; Notable for the following components:      Result Value   Preg Test, Ur Positive (*)    All other components within normal limits    EKG   Radiology No results found.  Procedures Procedures (including critical care  time)  Medications Ordered in UC Medications - No data to display  Initial Impression / Assessment and Plan / UC Course  I have reviewed the triage vital signs and the nursing notes.  Pertinent labs & imaging results that were available during my care of the patient were reviewed by me and considered in my medical decision making (see chart for details).  Positive pregnancy test  Urine pregnancy test positive, endorses able to manage nausea at this time without medicine, given written handout regarding advised to initiate prenatal vitamins which she endorses she has available at home, recommended notifying obstetrician to schedule follow-up prenatal care, advised against use of alcohol or tobacco as well as secondhand exposure, advised against use of NSAIDs and given list of safe medications, given strict ER precautions for signs of numbness. Final Clinical Impressions(s) / UC Diagnoses   Final diagnoses:  Positive pregnancy test     Discharge Instructions      Your pregnancy test today is positive.   Your will need evaluation from a women's health provider to determine gestational age( how far along you are) and estimated due date.    Please notify your obstetrician at [facility] clinic for prenatal care  you may start taking prenatal vitamin daily.   If you do, please stop drinking alcohol or smoking nicotine substances (cigarettes, vaping and hookah)  please stop any illicit drug use including marijuana, if this is a struggle for you, please notify for your women's health providers so they may help you   Inside your packet is a list of medications that are safe for you to use.    Any point if you begin to have abdominal cramping and/or vaginal bleeding please go to the nearest emergency department for immediate evaluation as unfortunately these can be signs of a miscarriage   ED Prescriptions   None    PDMP not reviewed this encounter.   Teresa Shelba SAUNDERS, NP 08/23/24  1439

## 2024-09-05 ENCOUNTER — Ambulatory Visit

## 2024-09-22 ENCOUNTER — Other Ambulatory Visit

## 2024-09-29 ENCOUNTER — Encounter: Admitting: Obstetrics
